# Patient Record
Sex: Female | Born: 1945 | Race: White | Hispanic: No | Marital: Married | Smoking: Never smoker
Health system: Southern US, Community
[De-identification: ages and names within clinical notes are randomized; demographics above are authoritative.]

## PROBLEM LIST (undated history)

## (undated) DIAGNOSIS — I10 Essential (primary) hypertension: Secondary | ICD-10-CM

## (undated) DIAGNOSIS — E7800 Pure hypercholesterolemia, unspecified: Secondary | ICD-10-CM

## (undated) DIAGNOSIS — I5030 Unspecified diastolic (congestive) heart failure: Secondary | ICD-10-CM

## (undated) DIAGNOSIS — G4730 Sleep apnea, unspecified: Secondary | ICD-10-CM

## (undated) DIAGNOSIS — M1990 Unspecified osteoarthritis, unspecified site: Secondary | ICD-10-CM

## (undated) HISTORY — DX: Essential (primary) hypertension: I10

## (undated) HISTORY — DX: Pure hypercholesterolemia, unspecified: E78.00

## (undated) HISTORY — DX: Unspecified osteoarthritis, unspecified site: M19.90

## (undated) HISTORY — PX: OTHER SURGICAL HISTORY: SHX169

## (undated) HISTORY — DX: Sleep apnea, unspecified: G47.30

## (undated) HISTORY — DX: Unspecified diastolic (congestive) heart failure: I50.30

## (undated) HISTORY — PX: BREAST BIOPSY: SHX20

---

## 1973-02-02 HISTORY — PX: TUBAL LIGATION: SHX77

## 1975-02-03 HISTORY — PX: PARTIAL HYSTERECTOMY: SHX80

## 1993-02-02 HISTORY — PX: ABDOMINAL HERNIA REPAIR: SHX539

## 1998-02-02 HISTORY — PX: CHOLECYSTECTOMY: SHX55

## 2006-02-02 HISTORY — PX: BREAST BIOPSY: SHX20

## 2006-09-22 ENCOUNTER — Encounter: Admission: RE | Admit: 2006-09-22 | Discharge: 2006-09-22 | Payer: Self-pay | Admitting: Unknown Physician Specialty

## 2006-09-22 ENCOUNTER — Encounter (INDEPENDENT_AMBULATORY_CARE_PROVIDER_SITE_OTHER): Payer: Self-pay | Admitting: Diagnostic Radiology

## 2007-09-27 ENCOUNTER — Encounter: Admission: RE | Admit: 2007-09-27 | Discharge: 2007-09-27 | Payer: Self-pay | Admitting: Unknown Physician Specialty

## 2008-09-27 ENCOUNTER — Encounter: Admission: RE | Admit: 2008-09-27 | Discharge: 2008-09-27 | Payer: Self-pay | Admitting: Unknown Physician Specialty

## 2009-10-11 ENCOUNTER — Encounter: Admission: RE | Admit: 2009-10-11 | Discharge: 2009-10-11 | Payer: Self-pay | Admitting: Unknown Physician Specialty

## 2010-09-10 ENCOUNTER — Other Ambulatory Visit: Payer: Self-pay | Admitting: Unknown Physician Specialty

## 2010-09-10 DIAGNOSIS — Z1231 Encounter for screening mammogram for malignant neoplasm of breast: Secondary | ICD-10-CM

## 2010-10-14 ENCOUNTER — Ambulatory Visit
Admission: RE | Admit: 2010-10-14 | Discharge: 2010-10-14 | Disposition: A | Discharge status: Home / Self Care | Payer: Medicare Other | Source: Ambulatory Visit | Attending: Unknown Physician Specialty | Admitting: Unknown Physician Specialty

## 2010-10-14 DIAGNOSIS — Z1231 Encounter for screening mammogram for malignant neoplasm of breast: Secondary | ICD-10-CM

## 2010-10-15 ENCOUNTER — Ambulatory Visit: Payer: Self-pay

## 2011-08-27 ENCOUNTER — Other Ambulatory Visit: Payer: Self-pay | Admitting: Unknown Physician Specialty

## 2011-08-27 DIAGNOSIS — Z1231 Encounter for screening mammogram for malignant neoplasm of breast: Secondary | ICD-10-CM

## 2011-10-22 ENCOUNTER — Ambulatory Visit
Admission: RE | Admit: 2011-10-22 | Discharge: 2011-10-22 | Disposition: A | Discharge status: Home / Self Care | Payer: Medicare Other | Source: Ambulatory Visit | Attending: Unknown Physician Specialty | Admitting: Unknown Physician Specialty

## 2011-10-22 DIAGNOSIS — Z1231 Encounter for screening mammogram for malignant neoplasm of breast: Secondary | ICD-10-CM

## 2012-09-09 ENCOUNTER — Other Ambulatory Visit: Payer: Self-pay

## 2012-09-09 DIAGNOSIS — Z1231 Encounter for screening mammogram for malignant neoplasm of breast: Secondary | ICD-10-CM

## 2012-10-24 ENCOUNTER — Ambulatory Visit
Admission: RE | Admit: 2012-10-24 | Discharge: 2012-10-24 | Disposition: A | Discharge status: Home / Self Care | Payer: Medicare Other | Source: Ambulatory Visit

## 2012-10-24 DIAGNOSIS — Z1231 Encounter for screening mammogram for malignant neoplasm of breast: Secondary | ICD-10-CM

## 2012-10-27 ENCOUNTER — Other Ambulatory Visit: Payer: Self-pay | Admitting: Unknown Physician Specialty

## 2012-10-27 DIAGNOSIS — R928 Other abnormal and inconclusive findings on diagnostic imaging of breast: Secondary | ICD-10-CM

## 2012-12-05 ENCOUNTER — Ambulatory Visit
Admission: RE | Admit: 2012-12-05 | Discharge: 2012-12-05 | Disposition: A | Discharge status: Home / Self Care | Payer: Medicare Other | Source: Ambulatory Visit | Attending: Unknown Physician Specialty | Admitting: Unknown Physician Specialty

## 2012-12-05 ENCOUNTER — Other Ambulatory Visit: Payer: Medicare Other

## 2012-12-05 DIAGNOSIS — R928 Other abnormal and inconclusive findings on diagnostic imaging of breast: Secondary | ICD-10-CM

## 2013-02-02 HISTORY — PX: HAND SURGERY: SHX662

## 2013-10-17 ENCOUNTER — Other Ambulatory Visit: Payer: Self-pay

## 2013-10-17 DIAGNOSIS — Z1231 Encounter for screening mammogram for malignant neoplasm of breast: Secondary | ICD-10-CM

## 2014-01-01 ENCOUNTER — Ambulatory Visit: Payer: Medicare Other

## 2014-01-16 ENCOUNTER — Encounter (INDEPENDENT_AMBULATORY_CARE_PROVIDER_SITE_OTHER): Payer: Self-pay | Admitting: *Deleted

## 2014-01-17 ENCOUNTER — Inpatient Hospital Stay: Admission: RE | Admit: 2014-01-17 | Payer: Medicare Other | Source: Ambulatory Visit

## 2014-01-17 ENCOUNTER — Ambulatory Visit
Admission: RE | Admit: 2014-01-17 | Discharge: 2014-01-17 | Disposition: A | Discharge status: Home / Self Care | Payer: Medicare Other | Source: Ambulatory Visit

## 2014-01-17 DIAGNOSIS — Z1231 Encounter for screening mammogram for malignant neoplasm of breast: Secondary | ICD-10-CM

## 2014-04-25 ENCOUNTER — Encounter (INDEPENDENT_AMBULATORY_CARE_PROVIDER_SITE_OTHER): Payer: Self-pay | Admitting: *Deleted

## 2014-04-25 ENCOUNTER — Other Ambulatory Visit (INDEPENDENT_AMBULATORY_CARE_PROVIDER_SITE_OTHER): Payer: Self-pay | Admitting: *Deleted

## 2014-04-25 DIAGNOSIS — Z1211 Encounter for screening for malignant neoplasm of colon: Secondary | ICD-10-CM

## 2014-05-08 ENCOUNTER — Telehealth: Payer: Self-pay

## 2014-05-08 NOTE — Telephone Encounter (Signed)
Received a referral on this pt from [facility] in [place].  Pt is already scheduled for colonoscopy with Dr. Laural Golden on 07/04/2014. I faxed the referral back to that office and let them know of that appt.

## 2014-05-21 ENCOUNTER — Telehealth: Payer: Self-pay

## 2014-05-21 NOTE — Telephone Encounter (Signed)
[place] HER TO SCHEDULE HER COLONOSCOPY  CELL IF NEEDED IS [phone]

## 2014-05-22 NOTE — Telephone Encounter (Signed)
I called pt and she has problems with constipation. Last colonoscopy was 2005 in [place]. She is scheduled for OV with Walden Field, NP on 06/11/2014 at 10:30 Am.  Per pt request, holding a slot on RMR schedule for TCS on 06/15/2014 at 10:30 ( she has to come from [place]).

## 2014-05-23 NOTE — Telephone Encounter (Signed)
Pt decided to hold the time for colonoscopy to 06/18/2014 at 10:30 Am .

## 2014-06-11 ENCOUNTER — Ambulatory Visit (INDEPENDENT_AMBULATORY_CARE_PROVIDER_SITE_OTHER): Payer: Medicare Other | Admitting: Nurse Practitioner

## 2014-06-11 ENCOUNTER — Encounter: Payer: Self-pay | Admitting: Nurse Practitioner

## 2014-06-11 ENCOUNTER — Encounter (INDEPENDENT_AMBULATORY_CARE_PROVIDER_SITE_OTHER): Payer: Self-pay

## 2014-06-11 ENCOUNTER — Other Ambulatory Visit: Payer: Self-pay

## 2014-06-11 VITALS — BP 152/92 | HR 66 | Temp 97.0°F | Ht 61.0 in | Wt 162.8 lb

## 2014-06-11 DIAGNOSIS — Z1211 Encounter for screening for malignant neoplasm of colon: Secondary | ICD-10-CM | POA: Insufficient documentation

## 2014-06-11 MED ORDER — NA SULFATE-K SULFATE-MG SULF 17.5-3.13-1.6 GM/177ML PO SOLN: 1.0000 | ORAL | Status: DC

## 2014-06-11 NOTE — Assessment & Plan Note (Signed)
69 year old female presents on referral from PCP for routine screening colonoscopy. Last colonoscopy in 2005 by Dr. Docia Furl who is since retired. No polyps found on this colonoscopy, no family history of colon cancer. Patient does have constipation, however it is baseline for her and she tolerates it well as well as treated over-the-counter as needed. At this point were proceed with her screening colonoscopy.  Proceed with TCS with Dr. Gala Romney in near future: the risks, benefits, and alternatives have been discussed with the patient in detail. The patient states understanding and desires to proceed.  Patient is not on anticoagulants or antidiabetic medications. Is not taking long-term pain medications or anti-anxiety medications. Denies drug use. Social alcohol use of approximately 1 glass of wine a week. At this point she should be appropriate for conscious sedation.

## 2014-06-11 NOTE — Progress Notes (Signed)
Primary Care Physician:  Lavonia Dana, MD Primary Gastroenterologist:  Dr. Gala Romney  Chief Complaint  Patient presents with  . set up TCS    HPI:   69 year old female patient referred to [place] by primary care for routine follow-up screening colonoscopy. PCP notes reviewed. Per PCP note, last patient colonoscopy was 10 years ago in 2005 by Dr.Shiflett which found no polyps. Patient without history of polyps or family history of colon cancer, recommended repeat in 10 years. Patient seen in office visit rather than triage due to constipation.  Today he states her 2005 colonoscopy was her first colonoscopy (age 46). Confirms no history of polyps or family history of colon cancer. Was due for repeat last year but was having hand surgery so it had to be postponed. Denies abdomina pain. Admits increased flatulence with a foul odor. Is having constipation, has a bowel movement anywhere from every other day to 1-2 times a week and it fluctuates. This is her baseline. Denies hematochezia and melena, unintentional weight loss, fever, chills, chest pain, dyspnea, weakness, dizziness, fatigue, syncope, and near syncope. Has tried Miralax for constipation and couldn't tolerate it due to diarrhea and unpalatable. Has tried dulcolax, tries to not take them, but will take them if she hasn't had a bowel movement recently. Drinks adequate water daily, walks often, eats a high fiber diet. Takes Naproxen as needed (typically once every 2-3), denies ASA powders or any other NSAID use.  Past Medical History  Diagnosis Date  . High cholesterol   . Hypertension   . Arthritis     Past Surgical History  Procedure Laterality Date  . Bone spur removal    . Abdominal hernia repair  1995  . Tubal ligation  1975  . Cholecystectomy  2000  . Partial hysterectomy  1977    Current Outpatient Prescriptions  Medication Sig Dispense Refill  . aspirin 81 MG tablet Take 81 mg by mouth daily.    . Calcium  Carbonate-Vitamin D (CALCIUM + D PO) Take 500 mg by mouth 2 (two) times daily.    . cholecalciferol (VITAMIN D) 1000 UNITS tablet Take 2,000 Units by mouth daily.    Marland Kitchen estradiol (ESTRACE) 1 MG tablet     . fexofenadine (ALLEGRA) 180 MG tablet Take 180 mg by mouth as needed for allergies or rhinitis.    . fluticasone (FLONASE) 50 MCG/ACT nasal spray Place 1 spray into both nostrils daily.    Marland Kitchen glucosamine-chondroitin 500-400 MG tablet Take 1 tablet by mouth 3 (three) times daily.    Marland Kitchen KRILL OIL PO Take 1 capsule by mouth 3 (three) times a week.    . naproxen sodium (ANAPROX) 220 MG tablet Take 220 mg by mouth as needed.     Marland Kitchen omeprazole (PRILOSEC) 40 MG capsule Take 40 mg by mouth daily.    . simvastatin (ZOCOR) 40 MG tablet      No current facility-administered medications for this visit.    Allergies as of 06/11/2014  . (No Known Allergies)    Family History  Problem Relation Age of Onset  . Colon cancer Neg Hx   . Colon polyps Neg Hx   . Hypercholesterolemia Mother     History   Social History  . Marital Status: Unknown    Spouse Name: N/A  . Number of Children: N/A  . Years of Education: N/A   Occupational History  . Not on file.   Social History Main Topics  . Smoking status: Never Smoker   .  Smokeless tobacco: Never Used  . Alcohol Use: 0.0 oz/week    0 Standard drinks or equivalent per week     Comment: occasional, approximately 1 glass of wine a week  . Drug Use: No  . Sexual Activity: Not on file   Other Topics Concern  . Not on file   Social History Narrative    Review of Systems: General: Negative for anorexia, weight loss, fever, chills, fatigue, weakness. Eyes: Negative for vision changes.  ENT: Negative for hoarseness, difficulty swallowing. CV: Negative for chest pain, angina, palpitations, peripheral edema.  Respiratory: Negative for dyspnea at rest, cough, wheezing.  GI: See history of present illness.  MS: Admits chronic arthritic joint  pain..  Derm: Negative for rash or itching.  Neuro: Negative for weakness, memory loss, confusion.  Psych: Negative for anxiety, depression.  Endo: Negative for unusual weight change.  Heme: Negative for bruising or bleeding. Allergy: Negative for rash or hives.    Physical Exam: BP 152/92 mmHg  Pulse 66  Temp(Src) 97 F (36.1 C)  Ht 5\' 1"  (1.549 m)  Wt 162 lb 12.8 oz (73.846 kg)  BMI 30.78 kg/m2 General:   Alert and oriented. Pleasant and cooperative. Well-nourished and well-developed.  Head:  Normocephalic and atraumatic. Eyes:  Without icterus, sclera clear and conjunctiva pink.  Ears:  Normal auditory acuity. Mouth:  No deformity or lesions, oral mucosa pink. No OP edema. Neck:  Supple, without mass or thyromegaly. Lungs:  Clear to auscultation bilaterally. No wheezes, rales, or rhonchi. No distress.  Heart:  S1, S2 present without murmurs appreciated.  Abdomen:  +BS, soft, non-tender and non-distended. No HSM noted. No guarding or rebound. No masses appreciated.  Rectal:  Deferred  Msk:  Symmetrical without gross deformities. Normal posture. Pulses:  Normal DP pulses noted. Extremities:  Without clubbing or edema. Neurologic:  Alert and  oriented x4;  grossly normal neurologically. Skin:  Intact without significant lesions or rashes. Cervical Nodes:  No significant cervical adenopathy. Psych:  Alert and cooperative. Normal mood and affect.     06/11/2014 11:50 AM

## 2014-06-11 NOTE — Patient Instructions (Signed)
1. We will schedule your procedure (colonoscopy) for you. 2. Further recommendations to be based on results of your procedure.

## 2014-06-18 ENCOUNTER — Encounter (HOSPITAL_COMMUNITY)
Admission: RE | Disposition: A | Discharge status: Home / Self Care | Payer: Self-pay | Source: Ambulatory Visit | Attending: Internal Medicine

## 2014-06-18 ENCOUNTER — Ambulatory Visit (HOSPITAL_COMMUNITY)
Admission: RE | Admit: 2014-06-18 | Discharge: 2014-06-18 | Disposition: A | Discharge status: Home / Self Care | Payer: Medicare Other | Source: Ambulatory Visit | Attending: Internal Medicine | Admitting: Internal Medicine

## 2014-06-18 ENCOUNTER — Encounter (HOSPITAL_COMMUNITY): Payer: Self-pay | Admitting: *Deleted

## 2014-06-18 DIAGNOSIS — Z9049 Acquired absence of other specified parts of digestive tract: Secondary | ICD-10-CM | POA: Diagnosis not present

## 2014-06-18 DIAGNOSIS — Z79899 Other long term (current) drug therapy: Secondary | ICD-10-CM | POA: Diagnosis not present

## 2014-06-18 DIAGNOSIS — Z791 Long term (current) use of non-steroidal anti-inflammatories (NSAID): Secondary | ICD-10-CM | POA: Insufficient documentation

## 2014-06-18 DIAGNOSIS — K5730 Diverticulosis of large intestine without perforation or abscess without bleeding: Secondary | ICD-10-CM | POA: Diagnosis not present

## 2014-06-18 DIAGNOSIS — Z1211 Encounter for screening for malignant neoplasm of colon: Secondary | ICD-10-CM | POA: Diagnosis present

## 2014-06-18 DIAGNOSIS — E780 Pure hypercholesterolemia: Secondary | ICD-10-CM | POA: Diagnosis not present

## 2014-06-18 DIAGNOSIS — Z7982 Long term (current) use of aspirin: Secondary | ICD-10-CM | POA: Diagnosis not present

## 2014-06-18 DIAGNOSIS — I10 Essential (primary) hypertension: Secondary | ICD-10-CM | POA: Insufficient documentation

## 2014-06-18 DIAGNOSIS — M1990 Unspecified osteoarthritis, unspecified site: Secondary | ICD-10-CM | POA: Diagnosis not present

## 2014-06-18 HISTORY — PX: COLONOSCOPY: SHX5424

## 2014-06-18 SURGERY — COLONOSCOPY
Anesthesia: Moderate Sedation

## 2014-06-18 MED ORDER — ONDANSETRON HCL 4 MG/2ML IJ SOLN
INTRAMUSCULAR | Status: DC | PRN
  Administered 2014-06-18: 4 mg via INTRAVENOUS

## 2014-06-18 MED ORDER — ONDANSETRON HCL 4 MG/2ML IJ SOLN
INTRAMUSCULAR | Status: AC
  Filled 2014-06-18: qty 2

## 2014-06-18 MED ORDER — MIDAZOLAM HCL 5 MG/5ML IJ SOLN
INTRAMUSCULAR | Status: AC
  Filled 2014-06-18: qty 10

## 2014-06-18 MED ORDER — STERILE WATER FOR IRRIGATION IR SOLN
Status: DC | PRN
  Administered 2014-06-18: 11:00:00

## 2014-06-18 MED ORDER — SODIUM CHLORIDE 0.9 % IV SOLN
INTRAVENOUS | Status: DC
  Administered 2014-06-18: 10:00:00 via INTRAVENOUS

## 2014-06-18 MED ORDER — MIDAZOLAM HCL 5 MG/5ML IJ SOLN
INTRAMUSCULAR | Status: DC | PRN
  Administered 2014-06-18 (×4): 1 mg via INTRAVENOUS
  Administered 2014-06-18 (×2): 2 mg via INTRAVENOUS
  Administered 2014-06-18: 1 mg via INTRAVENOUS

## 2014-06-18 MED ORDER — MEPERIDINE HCL 100 MG/ML IJ SOLN
INTRAMUSCULAR | Status: DC | PRN
  Administered 2014-06-18: 25 mg via INTRAVENOUS
  Administered 2014-06-18: 50 mg via INTRAVENOUS
  Administered 2014-06-18: 25 mg via INTRAVENOUS

## 2014-06-18 MED ORDER — MEPERIDINE HCL 100 MG/ML IJ SOLN
INTRAMUSCULAR | Status: DC
Start: 2014-06-18 — End: 2014-06-18
  Filled 2014-06-18: qty 2

## 2014-06-18 NOTE — Interval H&P Note (Signed)
History and Physical Interval Note:  06/18/2014 10:47 AM  Ana Chandler  has presented today for surgery, with the diagnosis of SCREENING  The various methods of treatment have been discussed with the patient and family. After consideration of risks, benefits and other options for treatment, the patient has consented to  Procedure(s) with comments: COLONOSCOPY (N/A) - 1030  as a surgical intervention .  The patient's history has been reviewed, patient examined, no change in status, stable for surgery.  I have reviewed the patient's chart and labs.  Questions were answered to the patient's satisfaction.     Hicks Feick  No change. Average risk screening colonoscopy per plan.  The risks, benefits, limitations, alternatives and imponderables have been reviewed with the patient. Questions have been answered. All parties are agreeable.

## 2014-06-18 NOTE — Op Note (Signed)
[facility] [address] [place], [address]   COLONOSCOPY PROCEDURE REPORT  PATIENT: Ana Chandler, Ana Chandler  MR#: [id number] BIRTHDATE: 1945-02-04 , 68  yrs. old GENDER: female ENDOSCOPIST: R.  Garfield Cornea, MD [facility] REFERRED BY:   Dr. Philipp Deputy PROCEDURE DATE:  July 16, 2014 PROCEDURE:   Screening colonoscopy INDICATIONS:Average risk colorectal cancer screening examination MEDICATIONS: Versed 9 mg IV and Demerol 100 mg IV in divided doses. Zofran 4 mg IV ASA CLASS:       Class II  CONSENT: The risks, benefits, alternatives and imponderables including but not limited to bleeding, perforation as well as the possibility of a missed lesion have been reviewed.  The potential for biopsy, lesion removal, etc. have also been discussed. Questions have been answered.  All parties agreeable.  Please see the history and physical in the medical record for more information.  DESCRIPTION OF PROCEDURE:   After the risks benefits and alternatives of the procedure were thoroughly explained, informed consent was obtained.  The digital rectal exam      The EC-[id number] ([id number])  endoscope was introduced through the anus and advanced to the cecum, which was identified by both the appendix and ileocecal valve. No adverse events experienced.   The quality of the prep was adequate  The instrument was then slowly withdrawn as the colon was fully examined.      COLON FINDINGS: Normal-appearing rectum.  Scattered pancolonic diverticula; the remainder of the colonic mucosa appeared normal. Retroflexion was performed.      .  Withdrawal time=6 minutes .   The scope was withdrawn and the procedure completed. COMPLICATIONS: There were no immediate complications.  ENDOSCOPIC IMPRESSION: Pancolonic diverticulosis  RECOMMENDATIONS: I do not necessarily recommend a future colonoscopy unless patient develops new symptoms.  eSigned:  R. Garfield Cornea, MD Rosalita Chessman [facility] 2014/07/16 11:26  AM   cc:  CPT CODES: ICD CODES:  The ICD and CPT codes recommended by this software are interpretations from the data that the clinical staff has captured with the software.  The verification of the translation of this report to the ICD and CPT codes and modifiers is the sole responsibility of the health care institution and practicing physician where this report was generated.  [employer]. will not be held responsible for the validity of the ICD and CPT codes included on this report.  AMA assumes no liability for data contained or not contained herein. CPT is a Designer, television/film set of the [employer].  PATIENT NAME:  Ana Chandler, Ana Chandler MR#: [id number]

## 2014-06-18 NOTE — H&P (View-Only) (Signed)
Primary Care Physician:  Lavonia Dana, MD Primary Gastroenterologist:  Dr. Gala Romney  Chief Complaint  Patient presents with  . set up TCS    HPI:   69 year old female patient referred to [place] by primary care for routine follow-up screening colonoscopy. PCP notes reviewed. Per PCP note, last patient colonoscopy was 10 years ago in 2005 by Dr.Shiflett which found no polyps. Patient without history of polyps or family history of colon cancer, recommended repeat in 10 years. Patient seen in office visit rather than triage due to constipation.  Today he states her 2005 colonoscopy was her first colonoscopy (age 57). Confirms no history of polyps or family history of colon cancer. Was due for repeat last year but was having hand surgery so it had to be postponed. Denies abdomina pain. Admits increased flatulence with a foul odor. Is having constipation, has a bowel movement anywhere from every other day to 1-2 times a week and it fluctuates. This is her baseline. Denies hematochezia and melena, unintentional weight loss, fever, chills, chest pain, dyspnea, weakness, dizziness, fatigue, syncope, and near syncope. Has tried Miralax for constipation and couldn't tolerate it due to diarrhea and unpalatable. Has tried dulcolax, tries to not take them, but will take them if she hasn't had a bowel movement recently. Drinks adequate water daily, walks often, eats a high fiber diet. Takes Naproxen as needed (typically once every 2-3), denies ASA powders or any other NSAID use.  Past Medical History  Diagnosis Date  . High cholesterol   . Hypertension   . Arthritis     Past Surgical History  Procedure Laterality Date  . Bone spur removal    . Abdominal hernia repair  1995  . Tubal ligation  1975  . Cholecystectomy  2000  . Partial hysterectomy  1977    Current Outpatient Prescriptions  Medication Sig Dispense Refill  . aspirin 81 MG tablet Take 81 mg by mouth daily.    . Calcium  Carbonate-Vitamin D (CALCIUM + D PO) Take 500 mg by mouth 2 (two) times daily.    . cholecalciferol (VITAMIN D) 1000 UNITS tablet Take 2,000 Units by mouth daily.    Marland Kitchen estradiol (ESTRACE) 1 MG tablet     . fexofenadine (ALLEGRA) 180 MG tablet Take 180 mg by mouth as needed for allergies or rhinitis.    . fluticasone (FLONASE) 50 MCG/ACT nasal spray Place 1 spray into both nostrils daily.    Marland Kitchen glucosamine-chondroitin 500-400 MG tablet Take 1 tablet by mouth 3 (three) times daily.    Marland Kitchen KRILL OIL PO Take 1 capsule by mouth 3 (three) times a week.    . naproxen sodium (ANAPROX) 220 MG tablet Take 220 mg by mouth as needed.     Marland Kitchen omeprazole (PRILOSEC) 40 MG capsule Take 40 mg by mouth daily.    . simvastatin (ZOCOR) 40 MG tablet      No current facility-administered medications for this visit.    Allergies as of 06/11/2014  . (No Known Allergies)    Family History  Problem Relation Age of Onset  . Colon cancer Neg Hx   . Colon polyps Neg Hx   . Hypercholesterolemia Mother     History   Social History  . Marital Status: Unknown    Spouse Name: N/A  . Number of Children: N/A  . Years of Education: N/A   Occupational History  . Not on file.   Social History Main Topics  . Smoking status: Never Smoker   .  Smokeless tobacco: Never Used  . Alcohol Use: 0.0 oz/week    0 Standard drinks or equivalent per week     Comment: occasional, approximately 1 glass of wine a week  . Drug Use: No  . Sexual Activity: Not on file   Other Topics Concern  . Not on file   Social History Narrative    Review of Systems: General: Negative for anorexia, weight loss, fever, chills, fatigue, weakness. Eyes: Negative for vision changes.  ENT: Negative for hoarseness, difficulty swallowing. CV: Negative for chest pain, angina, palpitations, peripheral edema.  Respiratory: Negative for dyspnea at rest, cough, wheezing.  GI: See history of present illness.  MS: Admits chronic arthritic joint  pain..  Derm: Negative for rash or itching.  Neuro: Negative for weakness, memory loss, confusion.  Psych: Negative for anxiety, depression.  Endo: Negative for unusual weight change.  Heme: Negative for bruising or bleeding. Allergy: Negative for rash or hives.    Physical Exam: BP 152/92 mmHg  Pulse 66  Temp(Src) 97 F (36.1 C)  Ht 5\' 1"  (1.549 m)  Wt 162 lb 12.8 oz (73.846 kg)  BMI 30.78 kg/m2 General:   Alert and oriented. Pleasant and cooperative. Well-nourished and well-developed.  Head:  Normocephalic and atraumatic. Eyes:  Without icterus, sclera clear and conjunctiva pink.  Ears:  Normal auditory acuity. Mouth:  No deformity or lesions, oral mucosa pink. No OP edema. Neck:  Supple, without mass or thyromegaly. Lungs:  Clear to auscultation bilaterally. No wheezes, rales, or rhonchi. No distress.  Heart:  S1, S2 present without murmurs appreciated.  Abdomen:  +BS, soft, non-tender and non-distended. No HSM noted. No guarding or rebound. No masses appreciated.  Rectal:  Deferred  Msk:  Symmetrical without gross deformities. Normal posture. Pulses:  Normal DP pulses noted. Extremities:  Without clubbing or edema. Neurologic:  Alert and  oriented x4;  grossly normal neurologically. Skin:  Intact without significant lesions or rashes. Cervical Nodes:  No significant cervical adenopathy. Psych:  Alert and cooperative. Normal mood and affect.     06/11/2014 11:50 AM

## 2014-06-18 NOTE — Discharge Instructions (Signed)
Colonoscopy Discharge Instructions  Read the instructions outlined below and refer to this sheet in the next few weeks. These discharge instructions provide you with general information on caring for yourself after you leave the hospital. Your doctor may also give you specific instructions. While your treatment has been planned according to the most current medical practices available, unavoidable complications occasionally occur. If you have any problems or questions after discharge, call Dr. Gala Romney at [phone]. ACTIVITY  You may resume your regular activity, but move at a slower pace for the next 24 hours.   Take frequent rest periods for the next 24 hours.   Walking will help get rid of the air and reduce the bloated feeling in your belly (abdomen).   No driving for 24 hours (because of the medicine (anesthesia) used during the test).    Do not sign any important legal documents or operate any machinery for 24 hours (because of the anesthesia used during the test).  NUTRITION  Drink plenty of fluids.   You may resume your normal diet as instructed by your doctor.   Begin with a light meal and progress to your normal diet. Heavy or fried foods are harder to digest and may make you feel sick to your stomach (nauseated).   Avoid alcoholic beverages for 24 hours or as instructed.  MEDICATIONS  You may resume your normal medications unless your doctor tells you otherwise.  WHAT YOU CAN EXPECT TODAY  Some feelings of bloating in the abdomen.   Passage of more gas than usual.   Spotting of blood in your stool or on the toilet paper.  IF YOU HAD POLYPS REMOVED DURING THE COLONOSCOPY:  No aspirin products for 7 days or as instructed.   No alcohol for 7 days or as instructed.   Eat a soft diet for the next 24 hours.  FINDING OUT THE RESULTS OF YOUR TEST Not all test results are available during your visit. If your test results are not back during the visit, make an appointment  with your caregiver to find out the results. Do not assume everything is normal if you have not heard from your caregiver or the medical facility. It is important for you to follow up on all of your test results.  SEEK IMMEDIATE MEDICAL ATTENTION IF:  You have more than a spotting of blood in your stool.   Your belly is swollen (abdominal distention).   You are nauseated or vomiting.   You have a temperature over 101.   You have abdominal pain or discomfort that is severe or gets worse throughout the day.    Diverticulosis information provided  I do not necessarily recommend a future colonoscopy unless she develops symptoms   Diverticulosis Diverticulosis is the condition that develops when small pouches (diverticula) form in the wall of your colon. Your colon, or large intestine, is where water is absorbed and stool is formed. The pouches form when the inside layer of your colon pushes through weak spots in the outer layers of your colon. CAUSES  No one knows exactly what causes diverticulosis. RISK FACTORS  Being older than 60. Your risk for this condition increases with age. Diverticulosis is rare in people younger than 40 years. By age 33, almost everyone has it.  Eating a low-fiber diet.  Being frequently constipated.  Being overweight.  Not getting enough exercise.  Smoking.  Taking over-the-counter pain medicines, like aspirin and ibuprofen. SYMPTOMS  Most people with diverticulosis do not have symptoms. DIAGNOSIS  Because diverticulosis often has no symptoms, health care providers often discover the condition during an exam for other colon problems. In many cases, a health care provider will diagnose diverticulosis while using a flexible scope to examine the colon (colonoscopy). TREATMENT  If you have never developed an infection related to diverticulosis, you may not need treatment. If you have had an infection before, treatment may include:  Eating more fruits,  vegetables, and grains.  Taking a fiber supplement.  Taking a live bacteria supplement (probiotic).  Taking medicine to relax your colon. HOME CARE INSTRUCTIONS   Drink at least 6-8 glasses of water each day to prevent constipation.  Try not to strain when you have a bowel movement.  Keep all follow-up appointments. If you have had an infection before:  Increase the fiber in your diet as directed by your health care provider or dietitian.  Take a dietary fiber supplement if your health care provider approves.  Only take medicines as directed by your health care provider. SEEK MEDICAL CARE IF:   You have abdominal pain.  You have bloating.  You have cramps.  You have not gone to the bathroom in 3 days. SEEK IMMEDIATE MEDICAL CARE IF:   Your pain gets worse.  Yourbloating becomes very bad.  You have a fever or chills, and your symptoms suddenly get worse.  You begin vomiting.  You have bowel movements that are bloody or black. MAKE SURE YOU:  Understand these instructions.  Will watch your condition.  Will get help right away if you are not doing well or get worse. Document Released: 10/17/2003 Document Revised: 01/24/2013 Document Reviewed: 12/14/2012 [facility] Patient Information 2015 [place], [place]. This information is not intended to replace advice given to you by your health care provider. Make sure you discuss any questions you have with your health care provider.

## 2014-06-20 ENCOUNTER — Encounter (HOSPITAL_COMMUNITY): Payer: Self-pay | Admitting: Internal Medicine

## 2014-06-21 NOTE — Progress Notes (Signed)
CC'ED TO PCP 

## 2014-07-04 ENCOUNTER — Encounter: Admission: RE | Payer: Self-pay | Source: Ambulatory Visit

## 2014-07-04 ENCOUNTER — Ambulatory Visit: Admission: RE | Admit: 2014-07-04 | Payer: Medicare Other | Source: Ambulatory Visit | Admitting: Internal Medicine

## 2014-07-04 SURGERY — COLONOSCOPY
Anesthesia: Moderate Sedation

## 2014-09-24 ENCOUNTER — Other Ambulatory Visit: Payer: Self-pay

## 2014-09-24 DIAGNOSIS — Z1231 Encounter for screening mammogram for malignant neoplasm of breast: Secondary | ICD-10-CM

## 2015-01-21 ENCOUNTER — Ambulatory Visit
Admission: RE | Admit: 2015-01-21 | Discharge: 2015-01-21 | Disposition: A | Discharge status: Home / Self Care | Payer: Medicare Other | Source: Ambulatory Visit

## 2015-01-21 DIAGNOSIS — Z1231 Encounter for screening mammogram for malignant neoplasm of breast: Secondary | ICD-10-CM

## 2015-02-03 HISTORY — PX: KNEE ARTHROSCOPY: SUR90

## 2015-12-17 ENCOUNTER — Other Ambulatory Visit: Payer: Self-pay | Admitting: Unknown Physician Specialty

## 2015-12-17 DIAGNOSIS — Z1231 Encounter for screening mammogram for malignant neoplasm of breast: Secondary | ICD-10-CM

## 2016-01-22 ENCOUNTER — Ambulatory Visit
Admission: RE | Admit: 2016-01-22 | Discharge: 2016-01-22 | Disposition: A | Discharge status: Home / Self Care | Payer: Medicare Other | Source: Ambulatory Visit | Attending: Unknown Physician Specialty | Admitting: Unknown Physician Specialty

## 2016-01-22 DIAGNOSIS — Z1231 Encounter for screening mammogram for malignant neoplasm of breast: Secondary | ICD-10-CM

## 2016-02-03 DIAGNOSIS — R0600 Dyspnea, unspecified: Secondary | ICD-10-CM

## 2016-02-03 HISTORY — DX: Dyspnea, unspecified: R06.00

## 2017-01-15 ENCOUNTER — Other Ambulatory Visit: Payer: Self-pay | Admitting: Obstetrics and Gynecology

## 2017-01-15 DIAGNOSIS — Z1231 Encounter for screening mammogram for malignant neoplasm of breast: Secondary | ICD-10-CM

## 2017-02-02 HISTORY — PX: EYE SURGERY: SHX253

## 2017-02-02 HISTORY — PX: CARDIAC CATHETERIZATION: SHX172

## 2017-03-29 ENCOUNTER — Ambulatory Visit
Admission: RE | Admit: 2017-03-29 | Discharge: 2017-03-29 | Disposition: A | Discharge status: Home / Self Care | Payer: Medicare Other | Source: Ambulatory Visit | Attending: Obstetrics and Gynecology | Admitting: Obstetrics and Gynecology

## 2017-03-29 DIAGNOSIS — Z1231 Encounter for screening mammogram for malignant neoplasm of breast: Secondary | ICD-10-CM

## 2017-12-08 ENCOUNTER — Other Ambulatory Visit (HOSPITAL_COMMUNITY): Payer: Self-pay | Admitting: Otolaryngology

## 2017-12-08 DIAGNOSIS — E041 Nontoxic single thyroid nodule: Secondary | ICD-10-CM

## 2017-12-13 ENCOUNTER — Ambulatory Visit (HOSPITAL_COMMUNITY)
Admission: RE | Admit: 2017-12-13 | Discharge: 2017-12-13 | Disposition: A | Discharge status: Home / Self Care | Payer: Medicare Other | Source: Ambulatory Visit | Attending: Otolaryngology | Admitting: Otolaryngology

## 2017-12-13 DIAGNOSIS — E042 Nontoxic multinodular goiter: Secondary | ICD-10-CM | POA: Diagnosis not present

## 2017-12-13 DIAGNOSIS — E041 Nontoxic single thyroid nodule: Secondary | ICD-10-CM | POA: Diagnosis present

## 2017-12-15 ENCOUNTER — Other Ambulatory Visit: Payer: Self-pay | Admitting: Otolaryngology

## 2017-12-15 DIAGNOSIS — E041 Nontoxic single thyroid nodule: Secondary | ICD-10-CM

## 2017-12-16 ENCOUNTER — Other Ambulatory Visit: Payer: Self-pay | Admitting: Otolaryngology

## 2017-12-16 DIAGNOSIS — E041 Nontoxic single thyroid nodule: Secondary | ICD-10-CM

## 2018-01-06 ENCOUNTER — Ambulatory Visit
Admission: RE | Admit: 2018-01-06 | Discharge: 2018-01-06 | Disposition: A | Discharge status: Home / Self Care | Payer: Medicare Other | Source: Ambulatory Visit | Attending: Otolaryngology | Admitting: Otolaryngology

## 2018-01-06 ENCOUNTER — Other Ambulatory Visit (HOSPITAL_COMMUNITY)
Admission: RE | Admit: 2018-01-06 | Discharge: 2018-01-06 | Disposition: A | Discharge status: Home / Self Care | Payer: Medicare Other | Source: Ambulatory Visit | Attending: Radiology | Admitting: Radiology

## 2018-01-06 DIAGNOSIS — E041 Nontoxic single thyroid nodule: Secondary | ICD-10-CM | POA: Insufficient documentation

## 2018-01-13 ENCOUNTER — Other Ambulatory Visit: Payer: Medicare Other

## 2018-02-09 ENCOUNTER — Other Ambulatory Visit: Payer: Self-pay | Admitting: Otolaryngology

## 2018-02-23 NOTE — Pre-Procedure Instructions (Signed)
Ana Chandler  02/23/2018      Your procedure is scheduled on March 04, 2018.  Report to [facility] Admitting at 08:00 A.M.  Call this number if you have problems the morning of surgery:  [phone]   Remember:  Do not eat or drink after midnight.    Take these medicines the morning of surgery with A SIP OF WATER : Amlodipine (Norvasc) Fluticasone (Flonase) Omeprazole (Prilosec)  Follow your doctor's instructions regarding your Asprin.  If no instructions were given, you will need to call the office to obtain instructions.    7 days prior to surgery STOP taking any Aspirin (unless otherwise instructed by your surgeon), Aleve, Naproxen, Ibuprofen, Motrin, Advil, Goody's, BC's, all herbal medications, fish oil, and all vitamins.     Do not wear jewelry, make-up or nail polish.  Do not wear lotions, powders, or perfumes, or deodorant.  Do not shave 48 hours prior to surgery.   Do not bring valuables to the hospital.  [facility] is not responsible for any belongings or valuables.  Contacts, dentures or bridgework may not be worn into surgery.  Leave your suitcase in the car.  After surgery it may be brought to your room.  For patients admitted to the hospital, discharge time will be determined by your treatment team.  Patients discharged the day of surgery will not be allowed to drive home.   Special instructions:   [facility]- Preparing For Surgery  Before surgery, you can play an important role. Because skin is not sterile, your skin needs to be as free of germs as possible. You can reduce the number of germs on your skin by washing with CHG (chlorahexidine gluconate) Soap before surgery.  CHG is an antiseptic cleaner which kills germs and bonds with the skin to continue killing germs even after washing.    Oral Hygiene is also important to reduce your risk of infection.  Remember - BRUSH YOUR TEETH THE MORNING OF SURGERY WITH YOUR REGULAR  TOOTHPASTE  Please do not use if you have an allergy to CHG or antibacterial soaps. If your skin becomes reddened/irritated stop using the CHG.  Do not shave (including legs and underarms) for at least 48 hours prior to first CHG shower. It is OK to shave your face.  Please follow these instructions carefully.   1. Shower the NIGHT BEFORE SURGERY and the MORNING OF SURGERY with CHG.   2. If you chose to wash your hair, wash your hair first as usual with your normal shampoo.  3. After you shampoo, rinse your hair and body thoroughly to remove the shampoo.  4. Use CHG as you would any other liquid soap. You can apply CHG directly to the skin and wash gently with a scrungie or a clean washcloth.   5. Apply the CHG Soap to your body ONLY FROM THE NECK DOWN.  Do not use on open wounds or open sores. Avoid contact with your eyes, ears, mouth and genitals (private parts). Wash Face and genitals (private parts)  with your normal soap.  6. Wash thoroughly, paying special attention to the area where your surgery will be performed.  7. Thoroughly rinse your body with warm water from the neck down.  8. DO NOT shower/wash with your normal soap after using and rinsing off the CHG Soap.  9. Pat yourself dry with a CLEAN TOWEL.  10. Wear CLEAN PAJAMAS to bed the night before surgery, wear comfortable clothes the  morning of surgery  11. Place CLEAN SHEETS on your bed the night of your first shower and DO NOT SLEEP WITH PETS.    Day of Surgery:  Do not apply any deodorants/lotions.  Please wear clean clothes to the hospital/surgery center.   Remember to brush your teeth WITH YOUR REGULAR TOOTHPASTE.    Please read over the following fact sheets that you were given.

## 2018-02-24 ENCOUNTER — Other Ambulatory Visit: Payer: Self-pay

## 2018-02-24 ENCOUNTER — Ambulatory Visit (HOSPITAL_COMMUNITY)
Admission: RE | Admit: 2018-02-24 | Discharge: 2018-02-24 | Disposition: A | Discharge status: Home / Self Care | Payer: Medicare Other | Source: Ambulatory Visit | Attending: Anesthesiology | Admitting: Anesthesiology

## 2018-02-24 ENCOUNTER — Encounter (HOSPITAL_COMMUNITY): Payer: Self-pay

## 2018-02-24 ENCOUNTER — Encounter (HOSPITAL_COMMUNITY)
Admission: RE | Admit: 2018-02-24 | Discharge: 2018-02-24 | Disposition: A | Discharge status: Home / Self Care | Payer: Medicare Other | Source: Ambulatory Visit | Attending: Otolaryngology | Admitting: Otolaryngology

## 2018-02-24 DIAGNOSIS — Z01818 Encounter for other preprocedural examination: Secondary | ICD-10-CM | POA: Insufficient documentation

## 2018-02-24 LAB — BASIC METABOLIC PANEL
Anion gap: 8 (ref 5–15)
BUN: 17 mg/dL (ref 8–23)
CO2: 27 mmol/L (ref 22–32)
Calcium: 9.6 mg/dL (ref 8.9–10.3)
Chloride: 106 mmol/L (ref 98–111)
Creatinine, Ser: 0.79 mg/dL (ref 0.44–1.00)
GFR calc Af Amer: >60 mL/min (ref >60)
GFR calc non Af Amer: >60 mL/min (ref >60)
GLUCOSE: 95 mg/dL (ref 70–99)
Potassium: 4 mmol/L (ref 3.5–5.1)
Sodium: 141 mmol/L (ref 135–145)

## 2018-02-24 LAB — CBC
HCT: 37.8 % (ref 36.0–46.0)
Hemoglobin: 10.9 g/dL — ABNORMAL LOW (ref 12.0–15.0)
MCH: 21.6 pg — ABNORMAL LOW (ref 26.0–34.0)
MCHC: 28.8 g/dL — ABNORMAL LOW (ref 30.0–36.0)
MCV: 74.9 fL — AB (ref 80.0–100.0)
Platelets: 328 10*3/uL (ref 150–400)
RBC: 5.05 MIL/uL (ref 3.87–5.11)
RDW: 19 % — ABNORMAL HIGH (ref 11.5–15.5)
WBC: 7.8 10*3/uL (ref 4.0–10.5)
nRBC: 0 % (ref 0.0–0.2)

## 2018-02-24 NOTE — Progress Notes (Signed)
[facility] [place], [place] Cardiologist - [facility] [place], [place] Pulmonologist- Dr. Cloria Spring- [place], [place]  Chest x-ray - 02/24/18 EKG - 02/24/18 Stress Test - denies ECHO - 2019- requested Cardiac Cath - 2019-requested  Sleep Study - 2019-requested CPAP - uses QHS- pressure settings 5-15.   Aspirin Instructions: patient advised to contact Dr. Redmond Baseman regarding her ASA. Patient instructed to hold all NSAID's, herbal medications, fish oil and vitamins 7 days prior to surgery.   Anesthesia review: cardiac history  Patient denies shortness of breath, fever, cough and chest pain at PAT appointment   Patient verbalized understanding of instructions that were given to them at the PAT appointment. Patient was also instructed that they will need to review over the PAT instructions again at home before surgery.

## 2018-02-25 ENCOUNTER — Other Ambulatory Visit (HOSPITAL_COMMUNITY): Payer: Medicare Other

## 2018-02-25 NOTE — Progress Notes (Signed)
Anesthesia Chart Review:  Case:  [id number] Date/Time:  03/04/18 0945   Procedure:  RIGHT THYROID LOBECTOMY (Right )   Anesthesia type:  General   Pre-op diagnosis:  thyroid nodules   Location:  [facility] OR ROOM 09 / [place] [facility]   Surgeon:  Melida Quitter, MD      DISCUSSION: 73 yo female never smoker. Pertinent hx includes HTN, DOE, LBBB.  Pt has undergone extensive workup for worsening DOE. She was evaluated by cardiologist at [facility] in [place]. Per Dr. Rodman Key Huffman's note 05/07/2017: "Continued symptoms which she says is severe with any exertion.  Extensive cardiac work-up done without clear etiology of symptoms.  Left heart cath 02/21/2017 with only mild disease.  LVEDP was 20 mmHg but BP not well controlled and no evidence of CHF.  Lungs clear on exam.  Echo 03/24/2017 reviewed independently today with normal LV systolic function and no major valvular disease.  Zio patch without sustained arrhythmias.  Will check chest x-ray and PFTs and refer to pulmonology."  Dr. Redmond Baseman' initial consult note 12/06/2017 in care everywhere also provides a review of workup for DOE: "One year ago, she started noticing a tight feeling in her chest and shortness of breath once in a while. She was evaluated by cardiology where a heart catheterization looked fine. She was prescribed amlodipine for hypertension. She was then evaluated by pulmonology who only found obstructive sleep apnea. She was prescribed CPAP. She has been tolerating it but does not feel any better. A chest CT was performed in September that demonstrated a thyroid nodule. Her shortness of breath is particularly a problem with exertion. The course has worsened and has become pretty severe with exertion.Marland KitchenMarland KitchenI reviewed her CT scan with her demonstrating a 3.2 cm right thyroid nodule that is causing slight compression of the trachea. I doubt that this degree of compression is causing her dyspnea. We will proceed with a thyroid ultrasound followed by an  ultrasound-guided FNA. I will call her with results. We may consider proceeding with right thyroid lobectomy even if pathology is benign due to her breathing problem."  Given her benign cardiology and pulmonology workup I anticipate she can proceed with surgery as planned.   VS: BP (!) 144/66   Pulse 72   Temp 36.4 C   Resp 20   Ht 5' (1.524 m)   Wt 75.3 kg   SpO2 98%   BMI 32.42 kg/m   PROVIDERS: Josem Kaufmann, MD is PCP  Robin Searing, MD is Cardiologist  Lorie Apley, MD is Pulmonologist  LABS: Labs reviewed: Acceptable for surgery. (all labs ordered are listed, but only abnormal results are displayed)  Labs Reviewed  CBC - Abnormal; Notable for the following components:      Result Value   Hemoglobin 10.9 (*)    MCV 74.9 (*)    MCH 21.6 (*)    MCHC 28.8 (*)    RDW 19.0 (*)    All other components within normal limits  BASIC METABOLIC PANEL     IMAGES: CHEST - 2 VIEW 02/24/18:  COMPARISON:  No recent prior.  FINDINGS: Mediastinum and hilar structures normal. Heart size normal. Lungs are clear. No pleural effusion. No pneumothorax. Degenerative change thoracic spine.  IMPRESSION: No acute cardiopulmonary disease.  EKG: 02/24/2018: Normal sinus rhythm with sinus arrhythmia. Rate 60. Left bundle branch block.  CV: TTE 04/01/2017 (outside record, copy on pt chart): Summary: 1.  Left ventricle: The cavity size is normal.  Wall thickness is moderately increased  in a pattern of concentric left ventricle hypertrophy.  Systolic function is normal.  The estimated ejection fraction was in the range of 55 to 65%.  Wall motion was normal; there were no regional wall motion abnormalities.  Diastolic dysfunction was in determinate. 2.  Mitral valve: There is mild regurgitation. 3.  Right ventricle: The cavity size is normal.  Systolic function is normal. 4.  Tricuspid valve: Mild regurgitation. 5.  Pulmonary arteries: Systolic pressure was within the normal  range, estimated to be 36 mmHg.  Cath 02/05/2017 (outside record, copy on pt chart): Impression: Mild coronary plaque disease.  No significant stenosis notified.  The left ventricular ejection fraction was 60%.  The overall left ventricular systolic function normal.  Mildly to moderately elevated left ventricular end-diastolic pressure, 20 mmHg.   Past Medical History:  Diagnosis Date  . Arthritis   . High cholesterol   . Hypertension     Past Surgical History:  Procedure Laterality Date  . ABDOMINAL HERNIA REPAIR  1995  . Bone Spur Removal    . BREAST BIOPSY Right   . CARDIAC CATHETERIZATION  2019  . CHOLECYSTECTOMY  2000  . COLONOSCOPY N/A 06/18/2014   Procedure: COLONOSCOPY;  Surgeon: Daneil Dolin, MD;  Location: [facility];  Service: Endoscopy;  Laterality: N/A;  1030   . EYE SURGERY Left 2019   Toric lens L eye  . HAND SURGERY Right 2015  . KNEE ARTHROSCOPY Right 2017  . PARTIAL HYSTERECTOMY  1977  . TUBAL LIGATION  1975    MEDICATIONS: . amLODipine (NORVASC) 5 MG tablet  . aspirin 81 MG tablet  . Calcium Carbonate-Vitamin D (CALCIUM + D PO)  . cholecalciferol (VITAMIN D) 1000 UNITS tablet  . estradiol (ESTRACE) 1 MG tablet  . fluticasone (FLONASE) 50 MCG/ACT nasal spray  . GLUCOSAMINE-CHONDROITIN PO  . Multiple Vitamins-Minerals (MULTIVITAMIN PO)  . naproxen sodium (ALEVE) 220 MG tablet  . omeprazole (PRILOSEC) 20 MG capsule  . Polyethylene Glycol 400 (BLINK TEARS) 0.25 % SOLN  . rosuvastatin (CRESTOR) 5 MG tablet   No current facility-administered medications for this encounter.     Wynonia Musty [facility] Short Stay Center/Anesthesiology Phone [phone] 02/28/2018 10:44 AM

## 2018-02-28 NOTE — Anesthesia Preprocedure Evaluation (Addendum)
Anesthesia Evaluation  Patient identified by MRN, date of birth, ID band Patient awake    Reviewed: Allergy & Precautions, NPO status , Patient's Chart, lab work & pertinent test results  Airway Mallampati: IV  TM Distance: >3 FB Neck ROM: Full    Dental no notable dental hx.    Pulmonary sleep apnea and Continuous Positive Airway Pressure Ventilation ,  Lorie Apley, MD is Pulmonologist   Pulmonary exam normal breath sounds clear to auscultation       Cardiovascular hypertension, Pt. on medications + DOE  Normal cardiovascular exam Rhythm:Regular Rate:Normal  ECG: rate 60. Normal sinus rhythm with sinus arrhythmia Left bundle branch block  Robin Searing, MD is Cardiologist   Neuro/Psych negative neurological ROS  negative psych ROS   GI/Hepatic Neg liver ROS, GERD  Medicated and Controlled,  Endo/Other  negative endocrine ROS  Renal/GU negative Renal ROS     Musculoskeletal negative musculoskeletal ROS (+)   Abdominal (+) + obese,   Peds  Hematology  (+) anemia , HLD   Anesthesia Other Findings thyroid nodules  Reproductive/Obstetrics                           Anesthesia Physical Anesthesia Plan  ASA: II  Anesthesia Plan: General   Post-op Pain Management:    Induction: Intravenous  PONV Risk Score and Plan: 3 and Ondansetron, Dexamethasone, Midazolam and Treatment may vary due to age or medical condition  Airway Management Planned: Oral ETT  Additional Equipment:   Intra-op Plan:   Post-operative Plan: Extubation in OR  Informed Consent: I have reviewed the patients History and Physical, chart, labs and discussed the procedure including the risks, benefits and alternatives for the proposed anesthesia with the patient or authorized representative who has indicated his/her understanding and acceptance.     Dental advisory given  Plan Discussed with:  CRNA  Anesthesia Plan Comments: (Reviewed PAT note by Karoline Caldwell, PA-C )       Anesthesia Quick Evaluation

## 2018-03-04 ENCOUNTER — Encounter (HOSPITAL_COMMUNITY): Payer: Self-pay | Admitting: Certified Registered"

## 2018-03-04 ENCOUNTER — Encounter (HOSPITAL_COMMUNITY)
Admission: RE | Disposition: A | Discharge status: Home / Self Care | Payer: Self-pay | Source: Home / Self Care | Attending: Otolaryngology

## 2018-03-04 ENCOUNTER — Ambulatory Visit (HOSPITAL_COMMUNITY): Payer: Medicare Other | Admitting: Physician Assistant

## 2018-03-04 ENCOUNTER — Ambulatory Visit (HOSPITAL_COMMUNITY): Payer: Medicare Other | Admitting: Anesthesiology

## 2018-03-04 ENCOUNTER — Other Ambulatory Visit: Payer: Self-pay

## 2018-03-04 ENCOUNTER — Observation Stay (HOSPITAL_COMMUNITY)
Admission: RE | Admit: 2018-03-04 | Discharge: 2018-03-05 | Disposition: A | Discharge status: Home / Self Care | Payer: Medicare Other | Attending: Otolaryngology | Admitting: Otolaryngology

## 2018-03-04 DIAGNOSIS — D34 Benign neoplasm of thyroid gland: Secondary | ICD-10-CM | POA: Diagnosis not present

## 2018-03-04 DIAGNOSIS — K219 Gastro-esophageal reflux disease without esophagitis: Secondary | ICD-10-CM | POA: Insufficient documentation

## 2018-03-04 DIAGNOSIS — Z79899 Other long term (current) drug therapy: Secondary | ICD-10-CM | POA: Insufficient documentation

## 2018-03-04 DIAGNOSIS — G4730 Sleep apnea, unspecified: Secondary | ICD-10-CM | POA: Diagnosis not present

## 2018-03-04 DIAGNOSIS — I10 Essential (primary) hypertension: Secondary | ICD-10-CM | POA: Insufficient documentation

## 2018-03-04 DIAGNOSIS — Z79890 Hormone replacement therapy (postmenopausal): Secondary | ICD-10-CM | POA: Diagnosis not present

## 2018-03-04 DIAGNOSIS — Z7951 Long term (current) use of inhaled steroids: Secondary | ICD-10-CM | POA: Insufficient documentation

## 2018-03-04 DIAGNOSIS — E785 Hyperlipidemia, unspecified: Secondary | ICD-10-CM | POA: Insufficient documentation

## 2018-03-04 DIAGNOSIS — E7800 Pure hypercholesterolemia, unspecified: Secondary | ICD-10-CM | POA: Insufficient documentation

## 2018-03-04 DIAGNOSIS — Z7982 Long term (current) use of aspirin: Secondary | ICD-10-CM | POA: Insufficient documentation

## 2018-03-04 DIAGNOSIS — M1990 Unspecified osteoarthritis, unspecified site: Secondary | ICD-10-CM | POA: Insufficient documentation

## 2018-03-04 DIAGNOSIS — E041 Nontoxic single thyroid nodule: Secondary | ICD-10-CM | POA: Diagnosis present

## 2018-03-04 HISTORY — PX: THYROIDECTOMY: SHX17

## 2018-03-04 HISTORY — PX: THYROID LOBECTOMY: SHX420

## 2018-03-04 SURGERY — THYROIDECTOMY
Anesthesia: General | Site: Neck | Laterality: Right

## 2018-03-04 MED ORDER — CALCIUM CARBONATE-VITAMIN D 500-200 MG-UNIT PO TABS
1.0000 | ORAL_TABLET | Freq: Two times a day (BID) | ORAL | Status: DC
  Administered 2018-03-04 – 2018-03-05 (×2): 1 via ORAL
  Filled 2018-03-04 (×2): qty 1

## 2018-03-04 MED ORDER — LACTATED RINGERS IV SOLN
INTRAVENOUS | Status: DC
  Administered 2018-03-04: 08:00:00 via INTRAVENOUS

## 2018-03-04 MED ORDER — LIDOCAINE-EPINEPHRINE 1 %-1:100000 IJ SOLN
INTRAMUSCULAR | Status: DC | PRN
  Administered 2018-03-04: 5 mL

## 2018-03-04 MED ORDER — KCL IN DEXTROSE-NACL 20-5-0.45 MEQ/L-%-% IV SOLN
INTRAVENOUS | Status: DC
  Administered 2018-03-04: 14:00:00 via INTRAVENOUS
  Filled 2018-03-04: qty 1000

## 2018-03-04 MED ORDER — ROCURONIUM BROMIDE 50 MG/5ML IV SOSY
PREFILLED_SYRINGE | INTRAVENOUS | Status: DC | PRN
  Administered 2018-03-04: 50 mg via INTRAVENOUS
  Administered 2018-03-04: 20 mg via INTRAVENOUS

## 2018-03-04 MED ORDER — MORPHINE SULFATE (PF) 2 MG/ML IV SOLN: 2.0000 mg | INTRAVENOUS | Status: DC | PRN

## 2018-03-04 MED ORDER — DEXAMETHASONE SODIUM PHOSPHATE 10 MG/ML IJ SOLN
INTRAMUSCULAR | Status: AC
  Filled 2018-03-04: qty 1

## 2018-03-04 MED ORDER — 0.9 % SODIUM CHLORIDE (POUR BTL) OPTIME
TOPICAL | Status: DC | PRN
  Administered 2018-03-04: 1000 mL

## 2018-03-04 MED ORDER — PROPOFOL 10 MG/ML IV BOLUS
INTRAVENOUS | Status: AC
  Filled 2018-03-04: qty 20

## 2018-03-04 MED ORDER — PROPOFOL 10 MG/ML IV BOLUS
INTRAVENOUS | Status: DC | PRN
  Administered 2018-03-04: 50 mg via INTRAVENOUS
  Administered 2018-03-04: 100 mg via INTRAVENOUS

## 2018-03-04 MED ORDER — ESTRADIOL 1 MG PO TABS: 1.0000 mg | ORAL_TABLET | ORAL | Status: DC

## 2018-03-04 MED ORDER — SODIUM CHLORIDE 0.9 % IV SOLN
INTRAVENOUS | Status: DC | PRN
  Administered 2018-03-04: 25 ug/min via INTRAVENOUS

## 2018-03-04 MED ORDER — SUGAMMADEX SODIUM 200 MG/2ML IV SOLN
INTRAVENOUS | Status: DC | PRN
  Administered 2018-03-04: 150 mg via INTRAVENOUS

## 2018-03-04 MED ORDER — ONDANSETRON HCL 4 MG/2ML IJ SOLN: 4.0000 mg | Freq: Once | INTRAMUSCULAR | Status: DC | PRN

## 2018-03-04 MED ORDER — ACETAMINOPHEN 325 MG PO TABS
650.0000 mg | ORAL_TABLET | Freq: Four times a day (QID) | ORAL | Status: DC | PRN
  Administered 2018-03-04 – 2018-03-05 (×2): 650 mg via ORAL
  Filled 2018-03-04 (×2): qty 2

## 2018-03-04 MED ORDER — PANTOPRAZOLE SODIUM 40 MG PO TBEC
40.0000 mg | DELAYED_RELEASE_TABLET | Freq: Every day | ORAL | Status: DC
  Administered 2018-03-05: 40 mg via ORAL
  Filled 2018-03-04: qty 1

## 2018-03-04 MED ORDER — ASPIRIN 81 MG PO CHEW
81.0000 mg | CHEWABLE_TABLET | Freq: Every day | ORAL | Status: DC
  Administered 2018-03-04 – 2018-03-05 (×2): 81 mg via ORAL
  Filled 2018-03-04 (×2): qty 1

## 2018-03-04 MED ORDER — ROSUVASTATIN CALCIUM 5 MG PO TABS
5.0000 mg | ORAL_TABLET | Freq: Every evening | ORAL | Status: DC
  Administered 2018-03-04: 5 mg via ORAL
  Filled 2018-03-04: qty 1

## 2018-03-04 MED ORDER — PHENYLEPHRINE 40 MCG/ML (10ML) SYRINGE FOR IV PUSH (FOR BLOOD PRESSURE SUPPORT)
PREFILLED_SYRINGE | INTRAVENOUS | Status: DC | PRN
  Administered 2018-03-04: 120 ug via INTRAVENOUS

## 2018-03-04 MED ORDER — HYDROCODONE-ACETAMINOPHEN 5-325 MG PO TABS: 1.0000 | ORAL_TABLET | ORAL | Status: DC | PRN

## 2018-03-04 MED ORDER — FENTANYL CITRATE (PF) 250 MCG/5ML IJ SOLN
INTRAMUSCULAR | Status: AC
  Filled 2018-03-04: qty 5

## 2018-03-04 MED ORDER — ROCURONIUM BROMIDE 50 MG/5ML IV SOSY
PREFILLED_SYRINGE | INTRAVENOUS | Status: AC
  Filled 2018-03-04: qty 5

## 2018-03-04 MED ORDER — CEFAZOLIN SODIUM-DEXTROSE 2-4 GM/100ML-% IV SOLN
2.0000 g | INTRAVENOUS | Status: AC
  Administered 2018-03-04: 2 g via INTRAVENOUS
  Filled 2018-03-04: qty 100

## 2018-03-04 MED ORDER — LIDOCAINE-EPINEPHRINE 1 %-1:100000 IJ SOLN
INTRAMUSCULAR | Status: AC
  Filled 2018-03-04: qty 1

## 2018-03-04 MED ORDER — FENTANYL CITRATE (PF) 100 MCG/2ML IJ SOLN
INTRAMUSCULAR | Status: AC
  Filled 2018-03-04: qty 2

## 2018-03-04 MED ORDER — POLYVINYL ALCOHOL 1.4 % OP SOLN
1.0000 [drp] | OPHTHALMIC | Status: DC
  Filled 2018-03-04: qty 15

## 2018-03-04 MED ORDER — ONDANSETRON HCL 4 MG/2ML IJ SOLN
INTRAMUSCULAR | Status: DC | PRN
  Administered 2018-03-04: 4 mg via INTRAVENOUS

## 2018-03-04 MED ORDER — FENTANYL CITRATE (PF) 100 MCG/2ML IJ SOLN
INTRAMUSCULAR | Status: DC | PRN
  Administered 2018-03-04: 50 ug via INTRAVENOUS
  Administered 2018-03-04: 150 ug via INTRAVENOUS
  Administered 2018-03-04: 50 ug via INTRAVENOUS

## 2018-03-04 MED ORDER — ONDANSETRON HCL 4 MG PO TABS: 4.0000 mg | ORAL_TABLET | ORAL | Status: DC | PRN

## 2018-03-04 MED ORDER — FENTANYL CITRATE (PF) 100 MCG/2ML IJ SOLN
25.0000 ug | INTRAMUSCULAR | Status: DC | PRN
  Administered 2018-03-04: 50 ug via INTRAVENOUS

## 2018-03-04 MED ORDER — MIDAZOLAM HCL 2 MG/2ML IJ SOLN
INTRAMUSCULAR | Status: DC | PRN
  Administered 2018-03-04 (×2): 1 mg via INTRAVENOUS

## 2018-03-04 MED ORDER — LIDOCAINE 2% (20 MG/ML) 5 ML SYRINGE
INTRAMUSCULAR | Status: AC
  Filled 2018-03-04: qty 5

## 2018-03-04 MED ORDER — HEMOSTATIC AGENTS (NO CHARGE) OPTIME
TOPICAL | Status: DC | PRN
  Administered 2018-03-04: 1 via TOPICAL

## 2018-03-04 MED ORDER — NAPROXEN SODIUM 275 MG PO TABS
275.0000 mg | ORAL_TABLET | Freq: Two times a day (BID) | ORAL | Status: DC | PRN
  Filled 2018-03-04: qty 1

## 2018-03-04 MED ORDER — SUGAMMADEX SODIUM 500 MG/5ML IV SOLN
INTRAVENOUS | Status: AC
  Filled 2018-03-04: qty 5

## 2018-03-04 MED ORDER — PHENYLEPHRINE 40 MCG/ML (10ML) SYRINGE FOR IV PUSH (FOR BLOOD PRESSURE SUPPORT)
PREFILLED_SYRINGE | INTRAVENOUS | Status: AC
  Filled 2018-03-04: qty 10

## 2018-03-04 MED ORDER — LIDOCAINE 2% (20 MG/ML) 5 ML SYRINGE
INTRAMUSCULAR | Status: DC | PRN
  Administered 2018-03-04: 80 mg via INTRAVENOUS

## 2018-03-04 MED ORDER — VITAMIN D 25 MCG (1000 UNIT) PO TABS
1000.0000 [IU] | ORAL_TABLET | Freq: Every day | ORAL | Status: DC
  Administered 2018-03-04 – 2018-03-05 (×2): 1000 [IU] via ORAL
  Filled 2018-03-04 (×2): qty 1

## 2018-03-04 MED ORDER — AMLODIPINE BESYLATE 2.5 MG PO TABS
2.5000 mg | ORAL_TABLET | Freq: Every day | ORAL | Status: DC
  Administered 2018-03-05: 2.5 mg via ORAL
  Filled 2018-03-04: qty 1

## 2018-03-04 MED ORDER — DEXAMETHASONE SODIUM PHOSPHATE 10 MG/ML IJ SOLN
INTRAMUSCULAR | Status: DC | PRN
  Administered 2018-03-04: 10 mg via INTRAVENOUS

## 2018-03-04 MED ORDER — ONDANSETRON HCL 4 MG/2ML IJ SOLN
INTRAMUSCULAR | Status: AC
  Filled 2018-03-04: qty 2

## 2018-03-04 MED ORDER — MIDAZOLAM HCL 2 MG/2ML IJ SOLN
INTRAMUSCULAR | Status: AC
  Filled 2018-03-04: qty 2

## 2018-03-04 MED ORDER — ONDANSETRON HCL 4 MG/2ML IJ SOLN: 4.0000 mg | INTRAMUSCULAR | Status: DC | PRN

## 2018-03-04 SURGICAL SUPPLY — 46 items
BLADE SURG 15 STRL LF DISP TIS (BLADE) ×1 IMPLANT
BLADE SURG 15 STRL SS (BLADE) ×1
CANISTER SUCT 3000ML PPV (MISCELLANEOUS) ×2 IMPLANT
CLEANER TIP ELECTROSURG 2X2 (MISCELLANEOUS) ×2 IMPLANT
CONT SPEC 4OZ CLIKSEAL STRL BL (MISCELLANEOUS) ×2 IMPLANT
CORD BIPOLAR FORCEPS 12FT (ELECTRODE) ×2 IMPLANT
COVER SURGICAL LIGHT HANDLE (MISCELLANEOUS) ×2 IMPLANT
DERMABOND ADVANCED (GAUZE/BANDAGES/DRESSINGS) ×1
DERMABOND ADVANCED .7 DNX12 (GAUZE/BANDAGES/DRESSINGS) ×1 IMPLANT
DRAIN JACKSON RD 7FR 3/32 (WOUND CARE) IMPLANT
ELECT COATED BLADE 2.86 ST (ELECTRODE) ×2 IMPLANT
ELECT REM PT RETURN 9FT ADLT (ELECTROSURGICAL) ×2
ELECTRODE REM PT RTRN 9FT ADLT (ELECTROSURGICAL) ×1 IMPLANT
EVACUATOR SILICONE 100CC (DRAIN) IMPLANT
FORCEPS BIPOLAR SPETZLER 8 1.0 (NEUROSURGERY SUPPLIES) ×2 IMPLANT
GAUZE 4X4 16PLY RFD (DISPOSABLE) ×2 IMPLANT
GLOVE BIO SURGEON STRL SZ 6.5 (GLOVE) ×2 IMPLANT
GLOVE BIO SURGEON STRL SZ7.5 (GLOVE) ×2 IMPLANT
GLOVE BIOGEL PI IND STRL 6.5 (GLOVE) ×1 IMPLANT
GLOVE BIOGEL PI IND STRL 7.0 (GLOVE) ×1 IMPLANT
GLOVE BIOGEL PI INDICATOR 6.5 (GLOVE) ×1
GLOVE BIOGEL PI INDICATOR 7.0 (GLOVE) ×1
GLOVE SURG SS PI 6.0 STRL IVOR (GLOVE) ×2 IMPLANT
GLOVE SURG SS PI 6.5 STRL IVOR (GLOVE) ×2 IMPLANT
GLOVE SURG SS PI 7.0 STRL IVOR (GLOVE) ×2 IMPLANT
GOWN STRL REUS W/ TWL LRG LVL3 (GOWN DISPOSABLE) ×4 IMPLANT
GOWN STRL REUS W/TWL LRG LVL3 (GOWN DISPOSABLE) ×4
HEMOSTAT SURGICEL 2X14 (HEMOSTASIS) ×2 IMPLANT
KIT BASIN OR (CUSTOM PROCEDURE TRAY) ×2 IMPLANT
KIT TURNOVER KIT B (KITS) ×2 IMPLANT
NEEDLE HYPO 25GX1X1/2 BEV (NEEDLE) ×4 IMPLANT
NS IRRIG 1000ML POUR BTL (IV SOLUTION) ×2 IMPLANT
PAD ARMBOARD 7.5X6 YLW CONV (MISCELLANEOUS) ×4 IMPLANT
PENCIL BUTTON HOLSTER BLD 10FT (ELECTRODE) ×2 IMPLANT
POSITIONER FOAM HEAD TRAC HOLE (MISCELLANEOUS) ×2 IMPLANT
SHEARS HARMONIC 9CM CVD (BLADE) ×2 IMPLANT
SPONGE INTESTINAL PEANUT (DISPOSABLE) ×2 IMPLANT
STAPLER VISISTAT 35W (STAPLE) ×2 IMPLANT
SUT ETHILON 2 0 FS 18 (SUTURE) IMPLANT
SUT SILK 3 0 REEL (SUTURE) ×2 IMPLANT
SUT VIC AB 3-0 SH 27 (SUTURE) ×1
SUT VIC AB 3-0 SH 27X BRD (SUTURE) ×1 IMPLANT
SUT VICRYL 4-0 PS2 18IN ABS (SUTURE) ×2 IMPLANT
TOWEL GREEN STERILE FF (TOWEL DISPOSABLE) ×2 IMPLANT
TRAY ENT MC OR (CUSTOM PROCEDURE TRAY) ×2 IMPLANT
TUBE FEEDING 10FR FLEXIFLO (MISCELLANEOUS) IMPLANT

## 2018-03-04 NOTE — Progress Notes (Signed)
   Subjective:    Patient ID: Ana Chandler, female    DOB: [birth year], 73 y.o.   MRN: [id number]  HPI Feels throat soreness and phlegm in throat.  Otherwise, doing well.  Review of Systems     Objective:   Physical Exam AF VSS Alert, NAD Normal voice Thyroid incision clean and intact, no fluid collection     Assessment & Plan:  Right thyroid lobectomy  Doing great.  Observe overnight.

## 2018-03-04 NOTE — Anesthesia Procedure Notes (Addendum)
Procedure Name: Intubation Date/Time: 03/04/2018 10:10 AM Performed by: Barrington Ellison, CRNA Pre-anesthesia Checklist: Patient identified, Emergency Drugs available, Suction available and Patient being monitored Patient Re-evaluated:Patient Re-evaluated prior to induction Oxygen Delivery Method: Circle System Utilized Preoxygenation: Pre-oxygenation with 100% oxygen Induction Type: IV induction Ventilation: Mask ventilation without difficulty Laryngoscope Size: Glidescope and 3 Grade View: Grade I Tube type: Oral Tube size: 7.0 mm Number of attempts: 2 Airway Equipment and Method: Stylet and Oral airway Placement Confirmation: ETT inserted through vocal cords under direct vision,  positive ETCO2 and breath sounds checked- equal and bilateral Secured at: 21 cm Tube secured with: Tape Dental Injury: Teeth and Oropharynx as per pre-operative assessment  Difficulty Due To: Difficulty was unanticipated and Difficult Airway- due to anterior larynx Future Recommendations: Recommend- induction with short-acting agent, and alternative techniques readily available Comments: DL x 1, Mac 3,  Grade 3 view, unable to angle ETT enough for insertion; DL x 1 with Glidescope  Grade 1 view

## 2018-03-04 NOTE — H&P (Signed)
Ana Chandler is an 73 y.o. female.   Chief Complaint: Thyroid nodule HPI: 73 year old female with sizeable right thyroid nodule that causes obstructive symptoms.  FNA was benign.  She presents for surgical management.  Past Medical History:  Diagnosis Date  . Arthritis   . High cholesterol   . Hypertension     Past Surgical History:  Procedure Laterality Date  . ABDOMINAL HERNIA REPAIR  1995  . Bone Spur Removal    . BREAST BIOPSY Right   . CARDIAC CATHETERIZATION  2019  . CHOLECYSTECTOMY  2000  . COLONOSCOPY N/A 06/18/2014   Procedure: COLONOSCOPY;  Surgeon: Daneil Dolin, MD;  [facility];  Service: Endoscopy;  Laterality: N/A;  1030   . EYE SURGERY Left 2019   Toric lens L eye  . HAND SURGERY Right 2015  . KNEE ARTHROSCOPY Right 2017  . PARTIAL HYSTERECTOMY  1977  . TUBAL LIGATION  1975    Family History  Problem Relation Age of Onset  . Hypercholesterolemia Mother   . COPD Mother   . Lung cancer Father   . Colon cancer Neg Hx   . Colon polyps Neg Hx    Social History:  reports that she has never smoked. She has never used smokeless tobacco. She reports current alcohol use. She reports that she does not use drugs.  Allergies:  Allergies  Allergen Reactions  . Lisinopril Other (See Comments)    Severe abdominal pain    Medications Prior to Admission  Medication Sig Dispense Refill  . amLODipine (NORVASC) 5 MG tablet Take 2.5 mg by mouth daily.    Marland Kitchen aspirin 81 MG tablet Take 81 mg by mouth daily.    . Calcium Carbonate-Vitamin D (CALCIUM + D PO) Take 1 tablet by mouth 2 (two) times daily.     . cholecalciferol (VITAMIN D) 1000 UNITS tablet Take 1,000 Units by mouth daily.     Marland Kitchen estradiol (ESTRACE) 1 MG tablet Take 1 mg by mouth 3 (three) times a week.     . fluticasone (FLONASE) 50 MCG/ACT nasal spray Place 1 spray into both nostrils daily.    Marland Kitchen GLUCOSAMINE-CHONDROITIN PO Take 1 tablet by mouth 2 (two) times daily.     . Multiple  Vitamins-Minerals (MULTIVITAMIN PO) Take 1 tablet by mouth daily.    . naproxen sodium (ALEVE) 220 MG tablet Take 220 mg by mouth 2 (two) times daily as needed (for pain or headache).    Marland Kitchen omeprazole (PRILOSEC) 20 MG capsule Take 20 mg by mouth daily.     . Polyethylene Glycol 400 (BLINK TEARS) 0.25 % SOLN Place 1 drop into the left eye every other day.    . rosuvastatin (CRESTOR) 5 MG tablet Take 5 mg by mouth daily.      No results found for this or any previous visit (from the past 48 hour(s)). No results found.  Review of Systems  Musculoskeletal: Positive for back pain.  All other systems reviewed and are negative.   Blood pressure (!) 160/60, pulse 74, temperature 97.7 F (36.5 C), temperature source Oral, resp. rate 20, height 5' (1.524 m), weight 72.6 kg, SpO2 98 %. Physical Exam   Assessment/Plan Right thyroid nodule To OR for right thyroid lobectomy.  Melida Quitter, MD 03/04/2018, 9:11 AM

## 2018-03-04 NOTE — Transfer of Care (Signed)
Immediate Anesthesia Transfer of Care Note  Patient: Ana Chandler  Procedure(s) Performed: RIGHT THYROID LOBECTOMY (Right Neck)  Patient Location: PACU  Anesthesia Type:General  Level of Consciousness: awake and oriented  Airway & Oxygen Therapy: Patient Spontanous Breathing and Patient connected to nasal cannula oxygen  Post-op Assessment: Report given to RN  Post vital signs: Reviewed and stable  Last Vitals:  Vitals Value Taken Time  BP 148/68 03/04/2018 11:33 AM  Temp    Pulse 93 03/04/2018 11:33 AM  Resp 13 03/04/2018 11:33 AM  SpO2 88 % 03/04/2018 11:33 AM  Vitals shown include unvalidated device data.  Last Pain:  Vitals:   03/04/18 0804  TempSrc: Oral         Complications: No apparent anesthesia complications

## 2018-03-04 NOTE — Brief Op Note (Signed)
03/04/2018  11:23 AM  PATIENT:  Ana Chandler  73 y.o. female  PRE-OPERATIVE DIAGNOSIS:  thyroid nodules  POST-OPERATIVE DIAGNOSIS:  thyroid nodules  PROCEDURE:  Procedure(s): RIGHT THYROID LOBECTOMY (Right)  SURGEON:  Surgeon(s) and Role:    Melida Quitter, MD - Primary  PHYSICIAN ASSISTANT: Sallee Provencal  ASSISTANTS: none   ANESTHESIA:   general  EBL:  30 mL   BLOOD ADMINISTERED:none  DRAINS: none   LOCAL MEDICATIONS USED:  LIDOCAINE   SPECIMEN:  Source of Specimen:  Right thyroid lobe  DISPOSITION OF SPECIMEN:  PATHOLOGY  COUNTS:  YES  TOURNIQUET:  * No tourniquets in log *  DICTATION: .Other Dictation: Dictation Number [id number]  PLAN OF CARE: Admit for overnight observation  PATIENT DISPOSITION:  PACU - hemodynamically stable.   Delay start of Pharmacological VTE agent (>24hrs) due to surgical blood loss or risk of bleeding: yes

## 2018-03-04 NOTE — Anesthesia Postprocedure Evaluation (Signed)
Anesthesia Post Note  Patient: Ana Chandler  Procedure(s) Performed: RIGHT THYROID LOBECTOMY (Right Neck)     Patient location during evaluation: PACU Anesthesia Type: General Level of consciousness: awake and alert Pain management: pain level controlled Vital Signs Assessment: post-procedure vital signs reviewed and stable Respiratory status: spontaneous breathing, nonlabored ventilation, respiratory function stable and patient connected to nasal cannula oxygen Cardiovascular status: blood pressure returned to baseline and stable Postop Assessment: no apparent nausea or vomiting Anesthetic complications: no    Last Vitals:  Vitals:   03/04/18 1225 03/04/18 1314  BP:  (!) 145/77  Pulse: 80 77  Resp: 16 17  Temp:  36.7 C  SpO2: 94% 99%    Last Pain:  Vitals:   03/04/18 1314  TempSrc: Oral  PainSc:                  Sairah Knobloch P Zylah Elsbernd

## 2018-03-05 DIAGNOSIS — D34 Benign neoplasm of thyroid gland: Secondary | ICD-10-CM | POA: Diagnosis not present

## 2018-03-05 NOTE — Discharge Summary (Signed)
Physician Discharge Summary  Patient ID: Ana Chandler MRN: [id number] DOB/AGE: [birth year] 73 y.o.  Admit date: 03/04/2018 Discharge date: 03/05/2018  Admission Diagnoses: Thyroid nodule  Discharge Diagnoses:  Active Problems:   Thyroid nodule   Discharged Condition: good  Hospital Course: 73 year old female with right-sided thyroid nodule and obstructive symptoms presented for thyroid lobectomy.  See operative note.  Observed overnight and did well.  Stable for discharge on POD 1.  Consults: None  Significant Diagnostic Studies: None  Treatments: surgery: Right thyroid lobectomy  Discharge Exam: Blood pressure 132/60, pulse 77, temperature 97.7 F (36.5 C), temperature source Oral, resp. rate 18, height 5' (1.524 m), weight 77 kg, SpO2 93 %. General appearance: alert, cooperative and no distress Neck: thyroid incision clean and intact, some adjacent edema, normal voice  Disposition: Discharge disposition: 01-Home or Self Care       Discharge Instructions    Diet - low sodium heart healthy   Complete by:  As directed    Discharge instructions   Complete by:  As directed    Avoid strenuous activity.  OK to allow incision to get wet, gently pat dry.  Do not apply ointment to the incision.  Tylenol and/or Aleve for pain.   Increase activity slowly   Complete by:  As directed      Allergies as of 03/05/2018      Reactions   Lisinopril Other (See Comments)   Severe abdominal pain      Medication List    TAKE these medications   amLODipine 5 MG tablet Commonly known as:  NORVASC Take 2.5 mg by mouth daily.   aspirin 81 MG tablet Take 81 mg by mouth daily.   BLINK TEARS 0.25 % Soln Generic drug:  Polyethylene Glycol 400 Place 1 drop into the left eye every other day.   CALCIUM + D PO Take 1 tablet by mouth 2 (two) times daily.   cholecalciferol 1000 units tablet Commonly known as:  VITAMIN D Take 1,000 Units by mouth daily.   estradiol 1 MG  tablet Commonly known as:  ESTRACE Take 1 mg by mouth 3 (three) times a week.   fluticasone 50 MCG/ACT nasal spray Commonly known as:  FLONASE Place 1 spray into both nostrils daily.   GLUCOSAMINE-CHONDROITIN PO Take 1 tablet by mouth 2 (two) times daily.   MULTIVITAMIN PO Take 1 tablet by mouth daily.   naproxen sodium 220 MG tablet Commonly known as:  ALEVE Take 220 mg by mouth 2 (two) times daily as needed (for pain or headache).   omeprazole 20 MG capsule Commonly known as:  PRILOSEC Take 20 mg by mouth daily.   rosuvastatin 5 MG tablet Commonly known as:  CRESTOR Take 5 mg by mouth daily.      Follow-up Information    Melida Quitter, MD. Schedule an appointment as soon as possible for a visit in 1 week.   Specialty:  Otolaryngology Contact information: [address] [place] [address] [phone]           Signed: Melida Quitter 03/05/2018, 8:56 AM

## 2018-03-05 NOTE — Plan of Care (Signed)
  Problem: Education: Goal: Knowledge of General Education information will improve Description Including pain rating scale, medication(s)/side effects and non-pharmacologic comfort measures Outcome: Progressing   

## 2018-03-05 NOTE — Progress Notes (Signed)
Patient discharged to home with instructions. 

## 2018-03-05 NOTE — Op Note (Signed)
NAME: Ana Chandler, Ana Chandler MEDICAL RECORD [id number] ACCOUNT [id number] DATE OF BIRTH:[birth year] FACILITY: [facility] LOCATION: [facility] PHYSICIAN:Avryl Roehm Guido Sander, MD  OPERATIVE REPORT  DATE OF PROCEDURE:  03/04/2018  PREOPERATIVE DIAGNOSIS:  Right thyroid nodule.  POSTOPERATIVE DIAGNOSIS:  Right thyroid nodule.  PROCEDURE:  Right thyroid lobectomy.  SURGEON:  Melida Quitter, MD  ANESTHESIA:  General endotracheal anesthesia.  COMPLICATIONS:  None.  INDICATIONS:  The patient is a 73 year old female who had a 3.2 cm right thyroid nodule noted on imaging.  Fine-needle aspiration was not concerning for cancer, but she feels an obstructed feeling with her breathing, and there is some compression of the  trachea.  She presents to the operating room for surgical management.  FINDINGS:  The right thyroid lobe was a little bit large with some nodularity and was sent for pathology.  DESCRIPTION OF PROCEDURE:  The patient was identified in the holding room, informed consent having been obtained, including discussion of risks, benefits and alternatives.  The patient was brought to the operative suite and put on the operative table in  supine position.  Anesthesia was induced, and the patient was intubated by the anesthesia team without difficulty.  The patient was given intravenous antibiotics during the case.  The eyes were taped closed and a shoulder roll was placed.  The neck  incision was marked with a marking pen and injected with 1% lidocaine with 1:100,000 epinephrine.  The neck was prepped and draped in sterile fashion.  Incision was made with a 15-blade scalpel through the skin and extended through the subcutaneous and  platysmal layer using Bovie electrocautery.  A self-retaining retractor was added.  The midline raphe of the strap muscles was divided and the right-sided strap muscles elevated over the thyroid lobe.  The lobe was then dissected around its periphery,  first with finger  dissection and then using the Harmonic scalpel.  This was first done up at the superior pole where initial dissection came into the gland a bit, so it was extended more superiorly across the pedicle.  The pole was then retracted  inferiorly and further dissection performed around the periphery of the lobe, freeing up the superior parathyroid gland, leaving it in place.  Continued dissection was then performed around the lateral extent of the gland and then around the inferior  extent, staying on the lobe and dissecting with the Harmonic scalpel.  The gland was then elevated from inferiorly and further dissection performed around the capsule of the gland up to the region of the recurrent laryngeal nerve where a small cuff of  thyroid tissue was left in place and the lobe removed.  The lobe was passed to nursing for pathology.  Bleeding was controlled with bipolar electrocautery.  The recurrent nerve was not seen during the case.  After copiously irrigated with saline, a  couple of pieces of Surgicel fabric were laid down into the thyroid bed.  The platysmal layer was closed with 3-0 Vicryl suture in a simple running fashion, and the subcutaneous layer was closed with 4-0 Vicryl suture in a simple interrupted fashion.   Dermabond was added to the skin.  Drapes were removed and the patient was cleaned off.  She returned to anesthesia for wakeup and was extubated while holding pressure against the surgical site.  She was moved to recovery room in stable condition.  LN/NUANCE  D:03/04/2018 T:03/05/2018 JOB:[id number]

## 2018-03-08 ENCOUNTER — Encounter (HOSPITAL_COMMUNITY): Payer: Self-pay | Admitting: Otolaryngology

## 2018-03-15 ENCOUNTER — Other Ambulatory Visit: Payer: Self-pay | Admitting: Obstetrics and Gynecology

## 2018-03-15 DIAGNOSIS — Z1231 Encounter for screening mammogram for malignant neoplasm of breast: Secondary | ICD-10-CM

## 2018-04-19 ENCOUNTER — Ambulatory Visit
Admission: RE | Admit: 2018-04-19 | Discharge: 2018-04-19 | Disposition: A | Discharge status: Home / Self Care | Payer: Medicare Other | Source: Ambulatory Visit | Attending: Obstetrics and Gynecology | Admitting: Obstetrics and Gynecology

## 2018-04-19 ENCOUNTER — Other Ambulatory Visit: Payer: Self-pay

## 2018-04-19 DIAGNOSIS — Z1231 Encounter for screening mammogram for malignant neoplasm of breast: Secondary | ICD-10-CM

## 2018-04-22 ENCOUNTER — Ambulatory Visit: Payer: Medicare Other

## 2019-03-31 ENCOUNTER — Other Ambulatory Visit: Payer: Self-pay | Admitting: Obstetrics and Gynecology

## 2019-03-31 DIAGNOSIS — Z1231 Encounter for screening mammogram for malignant neoplasm of breast: Secondary | ICD-10-CM

## 2019-04-28 ENCOUNTER — Other Ambulatory Visit: Payer: Self-pay

## 2019-04-28 ENCOUNTER — Encounter (HOSPITAL_COMMUNITY): Payer: Self-pay

## 2019-04-28 ENCOUNTER — Emergency Department (HOSPITAL_COMMUNITY)
Admission: EM | Admit: 2019-04-28 | Discharge: 2019-04-28 | Disposition: A | Discharge status: Home / Self Care | Payer: Medicare Other | Attending: Emergency Medicine | Admitting: Emergency Medicine

## 2019-04-28 DIAGNOSIS — R0789 Other chest pain: Secondary | ICD-10-CM | POA: Diagnosis not present

## 2019-04-28 DIAGNOSIS — T7840XA Allergy, unspecified, initial encounter: Secondary | ICD-10-CM | POA: Diagnosis present

## 2019-04-28 DIAGNOSIS — R202 Paresthesia of skin: Secondary | ICD-10-CM | POA: Diagnosis not present

## 2019-04-28 LAB — TROPONIN I (HIGH SENSITIVITY): Troponin I (High Sensitivity): 4 ng/L (ref <18)

## 2019-04-28 MED ORDER — EPINEPHRINE 0.3 MG/0.3ML IJ SOAJ: 0.3000 mg | Freq: Once | INTRAMUSCULAR | 2 refills | Status: AC

## 2019-04-28 NOTE — ED Triage Notes (Signed)
Pt got 2nd Moderna shot today and began having hives and oral swelling. Was given 125 mg Solumedrol as well as 50 of Benadryl. NAD. Pt's symptoms have resolved. Still having slight lip tingling.

## 2019-04-28 NOTE — ED Provider Notes (Addendum)
[facility] Provider Note   CSN: [id number] Arrival date & time: 04/28/19  1439     History Chief Complaint  Patient presents with  . Allergic Reaction    Ana Chandler is a 74 y.o. female.  Chief complaint allergic reaction.  Had second Covid Moderna shot today and then developed lip and tongue swelling and hives.  She was given Solu-Medrol 125 mg IV and Benadryl 50 mg IM.  Still with slight lip tingling, but good color and no airway compromise.  Severity of symptoms moderate.        Past Medical History:  Diagnosis Date  . Arthritis   . High cholesterol   . Hypertension     Patient Active Problem List   Diagnosis Date Noted  . Thyroid nodule 03/04/2018  . Diverticulosis of colon without hemorrhage   . Encounter for screening colonoscopy 06/11/2014    Past Surgical History:  Procedure Laterality Date  . ABDOMINAL HERNIA REPAIR  1995  . Bone Spur Removal    . BREAST BIOPSY Right   . CARDIAC CATHETERIZATION  2019  . CHOLECYSTECTOMY  2000  . COLONOSCOPY N/A 06/18/2014   Procedure: COLONOSCOPY;  Surgeon: Daneil Dolin, MD;  [facility]: [facility];  Service: Endoscopy;  Laterality: N/A;  1030   . EYE SURGERY Left 2019   Toric lens L eye  . HAND SURGERY Right 2015  . KNEE ARTHROSCOPY Right 2017  . PARTIAL HYSTERECTOMY  1977  . THYROID LOBECTOMY Right 03/04/2018  . THYROIDECTOMY Right 03/04/2018   Procedure: RIGHT THYROID LOBECTOMY;  Surgeon: Melida Quitter, MD;  Location: [place];  Service: ENT;  Laterality: Right;  . TUBAL LIGATION  1975     OB History   No obstetric history on file.     Family History  Problem Relation Age of Onset  . Hypercholesterolemia Mother   . COPD Mother   . Lung cancer Father   . Colon cancer Neg Hx   . Colon polyps Neg Hx     Social History   Tobacco Use  . Smoking status: Never Smoker  . Smokeless tobacco: Never Used  Substance Use Topics  . Alcohol use: Yes    Alcohol/week: 0.0  standard drinks    Comment: occasional, approximately 1 glass of wine a week  . Drug use: No    Home Medications Prior to Admission medications   Medication Sig Start Date End Date Taking? Authorizing Provider  amLODipine (NORVASC) 5 MG tablet Take 2.5 mg by mouth daily.    [provider]  aspirin 81 MG tablet Take 81 mg by mouth daily.    [provider]  Calcium Carbonate-Vitamin D (CALCIUM + D PO) Take 1 tablet by mouth 2 (two) times daily.     [provider]  cholecalciferol (VITAMIN D) 1000 UNITS tablet Take 1,000 Units by mouth daily.     [provider]  estradiol (ESTRACE) 1 MG tablet Take 1 mg by mouth 3 (three) times a week.  03/27/14   [provider]  fluticasone (FLONASE) 50 MCG/ACT nasal spray Place 1 spray into both nostrils daily.    [provider]  GLUCOSAMINE-CHONDROITIN PO Take 1 tablet by mouth 2 (two) times daily.     [provider]  Multiple Vitamins-Minerals (MULTIVITAMIN PO) Take 1 tablet by mouth daily.    [provider]  naproxen sodium (ALEVE) 220 MG tablet Take 220 mg by mouth 2 (two) times daily as needed (for pain or  headache).    [provider]  omeprazole (PRILOSEC) 20 MG capsule Take 20 mg by mouth daily.     [provider]  Polyethylene Glycol 400 (BLINK TEARS) 0.25 % SOLN Place 1 drop into the left eye every other day.    [provider]  rosuvastatin (CRESTOR) 5 MG tablet Take 5 mg by mouth daily.    [provider]    Allergies    Lisinopril and Losartan  Review of Systems   Review of Systems  All other systems reviewed and are negative.   Physical Exam Updated Vital Signs BP (!) 154/67 (BP Location: Left Arm)   Pulse 76   Temp 97.6 F (36.4 C) (Oral)   Resp 12   Ht 5' (1.524 m)   Wt 74.8 kg   SpO2 99%   BMI 32.22 kg/m   Physical Exam Vitals and nursing note reviewed.  Constitutional:      Appearance: She is  well-developed.     Comments: No respiratory distress.  HENT:     Head: Normocephalic and atraumatic.     Comments: She was able to stick out her tongue.  No obvious edema. Eyes:     Conjunctiva/sclera: Conjunctivae normal.  Cardiovascular:     Rate and Rhythm: Normal rate and regular rhythm.  Pulmonary:     Effort: Pulmonary effort is normal.     Breath sounds: Normal breath sounds.  Abdominal:     General: Bowel sounds are normal.     Palpations: Abdomen is soft.  Musculoskeletal:        General: Normal range of motion.     Cervical back: Neck supple.  Skin:    General: Skin is warm and dry.  Neurological:     General: No focal deficit present.     Mental Status: She is alert and oriented to person, place, and time.  Psychiatric:        Behavior: Behavior normal.     ED Results / Procedures / Treatments   Labs (all labs ordered are listed, but only abnormal results are displayed) Labs Reviewed - No data to display  EKG None  Radiology No results found.  Procedures Procedures (including critical care time)  Medications Ordered in ED Medications - No data to display  ED Course  I have reviewed the triage vital signs and the nursing notes.  Pertinent labs & imaging results that were available during my care of the patient were reviewed by me and considered in my medical decision making (see chart for details).    MDM Rules/Calculators/A&P                      Status post allergic phenomenon.  Patient is hemodynamically stable.  Will observe for a short period of time, then DC.  1800: Complaint of chest tightness.  This happened intermittently for 2 years.  She has been evaluated by cardiology.  No definitive answers.  Will obtain EKG and troponin. Final Clinical Impression(s) / ED Diagnoses Final diagnoses:  Allergic reaction, initial encounter    Rx / DC Orders ED Discharge Orders    None       Nat Christen, MD 04/28/19 1552    Nat Christen,  MD 04/28/19 [phone]

## 2019-04-28 NOTE — Discharge Instructions (Addendum)
Follow-up with your primary care doctor.  Prescription for EpiPen.  Can take Benadryl if needed

## 2019-06-05 ENCOUNTER — Encounter: Payer: Self-pay | Admitting: Gastroenterology

## 2019-06-13 ENCOUNTER — Ambulatory Visit: Payer: Medicare Other

## 2019-06-26 ENCOUNTER — Ambulatory Visit: Payer: Medicare Other | Admitting: Gastroenterology

## 2019-06-27 ENCOUNTER — Encounter: Payer: Self-pay | Admitting: Gastroenterology

## 2019-06-27 NOTE — Progress Notes (Addendum)
Primary Care Physician:  Josem Kaufmann, MD  Primary Gastroenterologist:  Garfield Cornea, MD   Chief Complaint  Patient presents with  . Anemia    has seen bright red blood in stool couple times about 1 month ago    HPI:  Ana Chandler is a 74 y.o. female here for further evaluation of anemia. Last seen in 2016 at time of colonoscopy. She had diverticulosis.   Hgb 11 in 01/2019 by PCP per patient. Husband has Alpha-Gal so she does not eat a lot of meat. Eating a lot of dark greens, chicken, fish. She complains of fatigue, DOE. Has been going back in forth between cardiology/pulmonology. Heart cath couple of years ago but may require another one in near future due to progressive symptoms. Cannot do house work or walk up a flight of steps. Heart monitor for two weeks, results pending. Breathing tests with pulmonologist. Had nuclear stress test which was ok. Some dizziness with going from sitting to standing. Gets chest pressure with exertion. Has to take a lot of breaks. Has an appointment next week at [facility] pulmonology with Dr. Daryel November.   Heartburn well controlled on omeprazole. No dysphagia. Some vague abdominal discomfort at times associated with weight gain due to DOE issues. BM usually 2-3 times per week is her normal. Occasional straining. No melena. Just a couple of episodes of brbpr with straining. Takes Aleve once daily. Has been on PPI for more than 5 years.   Labs from Jun 05, 2019: Creatinine 0.9, BUN 20 LFTs normal, TSH 3.66, white blood cell count 8100, hemoglobin 9, hematocrit 31.6, MCV 64, platelets 360,000.  Of note, review of epic showed hemoglobin of 10.9, MCV 74.9 back in January 2020.    Current Outpatient Medications  Medication Sig Dispense Refill  . aspirin 81 MG tablet Take 81 mg by mouth daily.    . Calcium Carbonate-Vitamin D (CALCIUM + D PO) Take 1 tablet by mouth 2 (two) times daily.     . cholecalciferol (VITAMIN D) 1000 UNITS tablet Take 1,000 Units by mouth  daily.     . fluticasone (FLONASE) 50 MCG/ACT nasal spray Place 1 spray into both nostrils daily.    Marland Kitchen GLUCOSAMINE-CHONDROITIN PO Take 1 tablet by mouth 2 (two) times daily.     . hydrochlorothiazide (HYDRODIURIL) 25 MG tablet Take 1 tablet by mouth daily.    . magnesium oxide (MAG-OX) 400 MG tablet Take 400 mg by mouth daily.    . Multiple Vitamins-Minerals (MULTIVITAMIN PO) Take 1 tablet by mouth daily.    . naproxen sodium (ALEVE) 220 MG tablet Take 220 mg by mouth 2 (two) times daily as needed (for pain or headache).    . NON FORMULARY Uses CPAP for sleep apnea    . omeprazole (PRILOSEC) 20 MG capsule Take 20 mg by mouth daily.     . Polyethylene Glycol 400 (BLINK TEARS) 0.25 % SOLN Place 1 drop into both eyes every other day.     . rosuvastatin (CRESTOR) 5 MG tablet Take 5 mg by mouth daily.     No current facility-administered medications for this visit.    Allergies as of 06/28/2019 - Review Complete 06/28/2019  Allergen Reaction Noted  . Lisinopril Other (See Comments) 06/18/2014  . Losartan  04/28/2019    Past Medical History:  Diagnosis Date  . Arthritis   . High cholesterol   . Hypertension   . Sleep apnea     Past Surgical History:  Procedure Laterality Date  .  ABDOMINAL HERNIA REPAIR  1995  . Bone Spur Removal    . BREAST BIOPSY Right   . CARDIAC CATHETERIZATION  2019  . CHOLECYSTECTOMY  2000  . COLONOSCOPY N/A 06/18/2014   [place]: diverticulosis  . EYE SURGERY Left 2019   Toric lens L eye  . HAND SURGERY Right 2015  . KNEE ARTHROSCOPY Right 2017  . PARTIAL HYSTERECTOMY  1977  . THYROID LOBECTOMY Right 03/04/2018  . THYROIDECTOMY Right 03/04/2018   Procedure: RIGHT THYROID LOBECTOMY;  Surgeon: Melida Quitter, MD;  Location: [place];  Service: ENT;  Laterality: Right;  . TUBAL LIGATION  1975    Family History  Problem Relation Age of Onset  . Hypercholesterolemia Mother   . COPD Mother   . Lung cancer Father   . Colon cancer Neg Hx   . Colon polyps Neg Hx      Social History   Socioeconomic History  . Marital status: Married    Spouse name: Not on file  . Number of children: Not on file  . Years of education: Not on file  . Highest education level: Not on file  Occupational History  . Not on file  Tobacco Use  . Smoking status: Never Smoker  . Smokeless tobacco: Never Used  Substance and Sexual Activity  . Alcohol use: Yes    Alcohol/week: 0.0 standard drinks    Comment: occ  . Drug use: No  . Sexual activity: Not on file  Other Topics Concern  . Not on file  Social History Narrative  . Not on file   Social Determinants of Health   Financial Resource Strain:   . Difficulty of Paying Living Expenses:   Food Insecurity:   . Worried About Charity fundraiser in the Last Year:   . Arboriculturist in the Last Year:   Transportation Needs:   . Film/video editor (Medical):   Marland Kitchen Lack of Transportation (Non-Medical):   Physical Activity:   . Days of Exercise per Week:   . Minutes of Exercise per Session:   Stress:   . Feeling of Stress :   Social Connections:   . Frequency of Communication with Friends and Family:   . Frequency of Social Gatherings with Friends and Family:   . Attends Religious Services:   . Active Member of Clubs or Organizations:   . Attends Archivist Meetings:   Marland Kitchen Marital Status:   Intimate Partner Violence:   . Fear of Current or Ex-Partner:   . Emotionally Abused:   Marland Kitchen Physically Abused:   . Sexually Abused:       ROS:  General: Negative for anorexia, weight loss, fever, chills, fatigue, weakness. Eyes: Negative for vision changes.  ENT: Negative for hoarseness, difficulty swallowing , nasal congestion. CV: Negative for chest pain, angina, palpitations, +dyspnea on exertion, peripheral edema.  Respiratory: Negative for dyspnea at rest, +dyspnea on exertion, cough, sputum, wheezing.  GI: See history of present illness. GU:  Negative for dysuria, hematuria, urinary incontinence,  urinary frequency, nocturnal urination.  MS: Positive for joint pain, low back pain.  Derm: Negative for rash or itching.  Neuro: Negative for weakness, abnormal sensation, seizure, frequent headaches, memory loss, confusion.  Psych: Negative for anxiety, depression, suicidal ideation, hallucinations.  Endo: Negative for unusual weight change.  Heme: Negative for bruising or bleeding. Allergy: Negative for rash or hives.    Physical Examination:  BP (!) 148/73   Pulse 66   Temp (!) 97.1 F (  36.2 C) (Temporal)   Ht 5' (1.524 m)   Wt 167 lb 6.4 oz (75.9 kg)   BMI 32.69 kg/m    General: Well-nourished, well-developed in no acute distress.  Head: Normocephalic, atraumatic.   Eyes: Conjunctiva pink, no icterus. Mouth: masked Neck: Supple without thyromegaly, masses, or lymphadenopathy.  Lungs: Clear to auscultation bilaterally.  Heart: Regular rate and rhythm, no murmurs rubs or gallops.  Abdomen: Bowel sounds are normal, nontender, nondistended, no hepatosplenomegaly or masses, no abdominal bruits or    hernia , no rebound or guarding.   Rectal: not performed Extremities: No lower extremity edema. No clubbing or deformities.  Neuro: Alert and oriented x 4 , grossly normal neurologically.  Skin: Warm and dry, no rash or jaundice.   Psych: Alert and cooperative, normal mood and affect.  Labs: Lab Results  Component Value Date   CREATININE 0.79 02/24/2018   BUN 17 02/24/2018   NA 141 02/24/2018   K 4.0 02/24/2018   CL 106 02/24/2018   CO2 27 02/24/2018   No results found for: ALT, AST, GGT, ALKPHOS, BILITOT Lab Results  Component Value Date   WBC 7.8 02/24/2018   HGB 10.9 (L) 02/24/2018   HCT 37.8 02/24/2018   MCV 74.9 (L) 02/24/2018   PLT 328 02/24/2018   No results found for: IRON, TIBC, FERRITIN No results found for: VITAMINB12 No results found for: FOLATE No results found for: ALT, AST, GGT, ALKPHOS, BILITOT   Imaging Studies: No results  found.  Impression/Plan:  74 y/o female with chronic GERD, progressive DOE/fatigue presenting for further evaluation of microcytic anemia. Review of Epic reveals microcytic anemia dating back to at least early 2020 and noted progressive over the past six months. She believes her anemia may in part be due to diminished oral intake of iron from food due to refraining from meat in the setting of husband's Alpha Gal disease. She has had very limited brbpr rarely with straining. She is on chronic Aleve for joint pain. Her last colonoscopy was in 2016.   Suspect IDA given progressive microcytic anemia, possibly nutritional reasons but need to exclude slow GI blood loss. Would offer her colonoscopy with EGD (screen for Barrett's and for anemia) in the near future but she is currently undergoing repeat evaluation with cardiology for worsening DOE. Some of her symptoms could be anemia related but would recommend complete cardiology evaluation prior to colonoscopy/EGD. In meantime will add oral iron. Check ifobt and update labs. Increase dietary iron.

## 2019-06-28 ENCOUNTER — Ambulatory Visit (INDEPENDENT_AMBULATORY_CARE_PROVIDER_SITE_OTHER): Payer: Medicare Other | Admitting: Gastroenterology

## 2019-06-28 ENCOUNTER — Ambulatory Visit
Admission: RE | Admit: 2019-06-28 | Discharge: 2019-06-28 | Disposition: A | Discharge status: Home / Self Care | Payer: Medicare Other | Source: Ambulatory Visit | Attending: Obstetrics and Gynecology | Admitting: Obstetrics and Gynecology

## 2019-06-28 ENCOUNTER — Other Ambulatory Visit: Payer: Self-pay

## 2019-06-28 ENCOUNTER — Encounter: Payer: Self-pay | Admitting: Gastroenterology

## 2019-06-28 DIAGNOSIS — D509 Iron deficiency anemia, unspecified: Secondary | ICD-10-CM | POA: Diagnosis not present

## 2019-06-28 DIAGNOSIS — Z1231 Encounter for screening mammogram for malignant neoplasm of breast: Secondary | ICD-10-CM

## 2019-06-28 DIAGNOSIS — K219 Gastro-esophageal reflux disease without esophagitis: Secondary | ICD-10-CM

## 2019-06-28 DIAGNOSIS — K625 Hemorrhage of anus and rectum: Secondary | ICD-10-CM | POA: Diagnosis not present

## 2019-06-28 NOTE — Patient Instructions (Addendum)
1. Please have labs done in 3 weeks or so.  2. Complete stool specimen and return to our office via mail. 3. I anticipate a colonoscopy in the near future for you; however, we need to wait for you to complete pending work up at [facility].  4. Start OTC iron supplement such as ferrous sulfate 325mg  twice per day. If you have worsening constipation, you should take OTC Miralax one capful once daily.    Iron-Rich Diet  Iron is a mineral that helps your body to produce hemoglobin. Hemoglobin is a protein in red blood cells that carries oxygen to your body's tissues. Eating too little iron may cause you to feel weak and tired, and it can increase your risk of infection. Iron is naturally found in many foods, and many foods have iron added to them (iron-fortified foods). You may need to follow an iron-rich diet if you do not have enough iron in your body due to certain medical conditions. The amount of iron that you need each day depends on your age, your sex, and any medical conditions you have. Follow instructions from your health care provider or a diet and nutrition specialist (dietitian) about how much iron you should eat each day. What are tips for following this plan? Reading food labels  Check food labels to see how many milligrams (mg) of iron are in each serving. Cooking  Cook foods in pots and pans that are made from iron.  Take these steps to make it easier for your body to absorb iron from certain foods: ? Soak beans overnight before cooking. ? Soak whole grains overnight and drain them before using. ? Ferment flours before baking, such as by using yeast in bread dough. Meal planning  When you eat foods that contain iron, you should eat them with foods that are high in vitamin C. These include oranges, peppers, tomatoes, potatoes, and mango. Vitamin C helps your body to absorb iron. General information  Take iron supplements only as told by your health care provider. An overdose of iron  can be life-threatening. If you were prescribed iron supplements, take them with orange juice or a vitamin C supplement.  When you eat iron-fortified foods or take an iron supplement, you should also eat foods that naturally contain iron, such as meat, poultry, and fish. Eating naturally iron-rich foods helps your body to absorb the iron that is added to other foods or contained in a supplement.  Certain foods and drinks prevent your body from absorbing iron properly. Avoid eating these foods in the same meal as iron-rich foods or with iron supplements. These foods include: ? Coffee, black tea, and red wine. ? Milk, dairy products, and foods that are high in calcium. ? Beans and soybeans. ? Whole grains. What foods should I eat? Fruits Prunes. Raisins. Eat fruits high in vitamin C, such as oranges, grapefruits, and strawberries, alongside iron-rich foods. Vegetables Spinach (cooked). Green peas. Broccoli. Fermented vegetables. Eat vegetables high in vitamin C, such as leafy greens, potatoes, bell peppers, and tomatoes, alongside iron-rich foods. Grains Iron-fortified breakfast cereal. Iron-fortified whole-wheat bread. Enriched rice. Sprouted grains. Meats and other proteins Beef liver. Oysters. Beef. Shrimp. [place]. Chicken. [place]. Sardines. Chickpeas. Nuts. Tofu. Pumpkin seeds. Beverages Tomato juice. Fresh orange juice. Prune juice. Hibiscus tea. Fortified instant breakfast shakes. Sweets and desserts Blackstrap molasses. Seasonings and condiments Tahini. Fermented soy sauce. Other foods Wheat germ. The items listed above may not be a complete list of recommended foods and beverages. Contact a  dietitian for more information. What foods should I avoid? Grains Whole grains. Bran cereal. Bran flour. Oats. Meats and other proteins Soybeans. Products made from soy protein. Black beans. Lentils. Mung beans. Split peas. Dairy Milk. Cream. Cheese. Yogurt. Cottage  cheese. Beverages Coffee. Black tea. Red wine. Sweets and desserts Cocoa. Chocolate. Ice cream. Other foods Basil. Oregano. Large amounts of parsley. The items listed above may not be a complete list of foods and beverages to avoid. Contact a dietitian for more information. Summary  Iron is a mineral that helps your body to produce hemoglobin. Hemoglobin is a protein in red blood cells that carries oxygen to your body's tissues.  Iron is naturally found in many foods, and many foods have iron added to them (iron-fortified foods).  When you eat foods that contain iron, you should eat them with foods that are high in vitamin C. Vitamin C helps your body to absorb iron.  Certain foods and drinks prevent your body from absorbing iron properly, such as whole grains and dairy products. You should avoid eating these foods in the same meal as iron-rich foods or with iron supplements. This information is not intended to replace advice given to you by your health care provider. Make sure you discuss any questions you have with your health care provider. Document Revised: 01/01/2017 Document Reviewed: 12/15/2016 Elsevier Patient Education  2020 [employer].

## 2019-06-29 ENCOUNTER — Ambulatory Visit: Payer: Medicare Other

## 2019-07-12 ENCOUNTER — Ambulatory Visit (INDEPENDENT_AMBULATORY_CARE_PROVIDER_SITE_OTHER): Payer: Self-pay

## 2019-07-12 ENCOUNTER — Other Ambulatory Visit: Payer: Self-pay

## 2019-07-12 ENCOUNTER — Telehealth: Payer: Self-pay

## 2019-07-12 DIAGNOSIS — D649 Anemia, unspecified: Secondary | ICD-10-CM

## 2019-07-12 LAB — IFOBT (OCCULT BLOOD): IFOBT: NEGATIVE

## 2019-07-12 NOTE — Telephone Encounter (Signed)
IFOBT was received. Results are negative. Routing to another provider in the Absence of LSL.

## 2019-07-12 NOTE — Telephone Encounter (Signed)
Noted. IFOBT is negative. LSL had also ordered labs for patient to complete at her last office visit. She needs to have these completed. Further recommendations when LSL returns.   Routing to LSL for [employer]

## 2019-07-13 NOTE — Telephone Encounter (Signed)
Spoke with pt. Pt notified of results and further recommendations to come by LSL. Pt is having labs done next week at her PCP's office.

## 2019-07-17 NOTE — Telephone Encounter (Signed)
Noted. Await labs from pcp

## 2019-08-14 ENCOUNTER — Telehealth: Payer: Self-pay | Admitting: *Deleted

## 2019-08-14 NOTE — Telephone Encounter (Signed)
Received VM from patient requesting to schedule procedure. She states she has been cleared by [facility]. We do not have orders on pt. Please advise

## 2019-08-15 ENCOUNTER — Telehealth: Payer: Self-pay | Admitting: Internal Medicine

## 2019-08-15 NOTE — Telephone Encounter (Signed)
See prior note. Message sent to [facility]

## 2019-08-15 NOTE — Telephone Encounter (Signed)
Pt called to say that she has been cleared from [facility] to schedule her colonoscopy. [phone]

## 2019-08-17 NOTE — Telephone Encounter (Signed)
Reviewed cardiology records in care everywhere.  Clearance for procedures have been approved.  Please let patient know that we would recommend a colonoscopy to further evaluate microcytic anemia.  Also would recommend upper endoscopy at the same time for both anemia and to screen for Barrett's esophagus given chronic GERD.  If patient is agreeable: - please schedule EGD/colonoscopy with Dr. Gala Romney in [place] with propofol -Please update her medication list, if any changes make me aware so that I can provide appropriate orders -ASA II

## 2019-08-18 NOTE — Telephone Encounter (Signed)
Called patient and made aware of below. She was agreeable. Only medication change was crestor is now 20mg  daily. She is aware will cal with Sept schedule with [facility] to schedule procedure

## 2019-08-22 NOTE — Telephone Encounter (Signed)
Noted. Ok to scheduled as per previous orders listed below.

## 2019-09-08 ENCOUNTER — Encounter: Payer: Self-pay | Admitting: *Deleted

## 2019-09-08 NOTE — Telephone Encounter (Signed)
Called pt. She is scheduled for TCS/EGD with propofol with Dr. Gala Romney. She is aware she will need pre-op/covid test prior. Advised will mail this to her with her prep instructions. Confirmed mailing address.

## 2019-09-25 ENCOUNTER — Telehealth: Payer: Self-pay | Admitting: Internal Medicine

## 2019-09-25 NOTE — Telephone Encounter (Signed)
Pt is scheduled to have procedure by Dr Gala Romney on 10/23/2019 and has questions about taking her iron. Please call [phone]

## 2019-09-25 NOTE — Telephone Encounter (Signed)
Called patient  And she states she takes iron 325 once a day (not on med list). Aware she needs to hold this x 1 week prior. She voiced understanding

## 2019-10-13 NOTE — Patient Instructions (Signed)
Ana Chandler  10/13/2019     @PREFPERIOPPHARMACY @   Your procedure is scheduled on  10/23/2019.  Report to [facility] at  [place].  Call this number if you have problems the morning of surgery:  [phone]   Remember:  Follow the diet and prep instructions given to you by the office.                     Take these medicines the morning of surgery with A SIP OF WATER  prilosec.    Do not wear jewelry, make-up or nail polish.  Do not wear lotions, powders, or perfumes,. Please wear deodorant and brush your teeth.  Do not shave 48 hours prior to surgery.  Men may shave face and neck.  Do not bring valuables to the hospital.  [facility] is not responsible for any belongings or valuables.  Contacts, dentures or bridgework may not be worn into surgery.  Leave your suitcase in the car.  After surgery it may be brought to your room.  For patients admitted to the hospital, discharge time will be determined by your treatment team.  Patients discharged the day of surgery will not be allowed to drive home.   Name and phone number of your driver:   family Special instructions:  DO NOT smoke the morning of your procedure.  Please read over the following fact sheets that you were given. Anesthesia Post-op Instructions and Care and Recovery After Surgery       Upper Endoscopy, Adult, Care After This sheet gives you information about how to care for yourself after your procedure. Your health care provider may also give you more specific instructions. If you have problems or questions, contact your health care provider. What can I expect after the procedure? After the procedure, it is common to have:  A sore throat.  Mild stomach pain or discomfort.  Bloating.  Nausea. Follow these instructions at home:   Follow instructions from your health care provider about what to eat or drink after your procedure.  Return to your normal activities as told by  your health care provider. Ask your health care provider what activities are safe for you.  Take over-the-counter and prescription medicines only as told by your health care provider.  Do not drive for 24 hours if you were given a sedative during your procedure.  Keep all follow-up visits as told by your health care provider. This is important. Contact a health care provider if you have:  A sore throat that lasts longer than one day.  Trouble swallowing. Get help right away if:  You vomit blood or your vomit looks like coffee grounds.  You have: ? A fever. ? Bloody, black, or tarry stools. ? A severe sore throat or you cannot swallow. ? Difficulty breathing. ? Severe pain in your chest or abdomen. Summary  After the procedure, it is common to have a sore throat, mild stomach discomfort, bloating, and nausea.  Do not drive for 24 hours if you were given a sedative during the procedure.  Follow instructions from your health care provider about what to eat or drink after your procedure.  Return to your normal activities as told by your health care provider. This information is not intended to replace advice given to you by your health care provider. Make sure you discuss any questions you have with your health care provider. Document Revised: 07/13/2017 Document  Reviewed: 06/21/2017 Elsevier Patient Education  [place].  Colonoscopy, Adult, Care After This sheet gives you information about how to care for yourself after your procedure. Your health care provider may also give you more specific instructions. If you have problems or questions, contact your health care provider. What can I expect after the procedure? After the procedure, it is common to have:  A small amount of blood in your stool for 24 hours after the procedure.  Some gas.  Mild cramping or bloating of your abdomen. Follow these instructions at home: Eating and drinking   Drink enough fluid to  keep your urine pale yellow.  Follow instructions from your health care provider about eating or drinking restrictions.  Resume your normal diet as instructed by your health care provider. Avoid heavy or fried foods that are hard to digest. Activity  Rest as told by your health care provider.  Avoid sitting for a long time without moving. Get up to take short walks every 1-2 hours. This is important to improve blood flow and breathing. Ask for help if you feel weak or unsteady.  Return to your normal activities as told by your health care provider. Ask your health care provider what activities are safe for you. Managing cramping and bloating   Try walking around when you have cramps or feel bloated.  Apply heat to your abdomen as told by your health care provider. Use the heat source that your health care provider recommends, such as a moist heat pack or a heating pad. ? Place a towel between your skin and the heat source. ? Leave the heat on for 20-30 minutes. ? Remove the heat if your skin turns bright red. This is especially important if you are unable to feel pain, heat, or cold. You may have a greater risk of getting burned. General instructions  For the first 24 hours after the procedure: ? Do not drive or use machinery. ? Do not sign important documents. ? Do not drink alcohol. ? Do your regular daily activities at a slower pace than normal. ? Eat soft foods that are easy to digest.  Take over-the-counter and prescription medicines only as told by your health care provider.  Keep all follow-up visits as told by your health care provider. This is important. Contact a health care provider if:  You have blood in your stool 2-3 days after the procedure. Get help right away if you have:  More than a small spotting of blood in your stool.  Large blood clots in your stool.  Swelling of your abdomen.  Nausea or vomiting.  A fever.  Increasing pain in your abdomen that  is not relieved with medicine. Summary  After the procedure, it is common to have a small amount of blood in your stool. You may also have mild cramping and bloating of your abdomen.  For the first 24 hours after the procedure, do not drive or use machinery, sign important documents, or drink alcohol.  Get help right away if you have a lot of blood in your stool, nausea or vomiting, a fever, or increased pain in your abdomen. This information is not intended to replace advice given to you by your health care provider. Make sure you discuss any questions you have with your health care provider. Document Revised: 08/15/2018 Document Reviewed: 08/15/2018 Elsevier Patient Education  [place] After These instructions provide you with information about caring for yourself after your procedure.  Your health care provider may also give you more specific instructions. Your treatment has been planned according to current medical practices, but problems sometimes occur. Call your health care provider if you have any problems or questions after your procedure. What can I expect after the procedure? After your procedure, you may:  Feel sleepy for several hours.  Feel clumsy and have poor balance for several hours.  Feel forgetful about what happened after the procedure.  Have poor judgment for several hours.  Feel nauseous or vomit.  Have a sore throat if you had a breathing tube during the procedure. Follow these instructions at home: For at least 24 hours after the procedure:      Have a responsible adult stay with you. It is important to have someone help care for you until you are awake and alert.  Rest as needed.  Do not: ? Participate in activities in which you could fall or become injured. ? Drive. ? Use heavy machinery. ? Drink alcohol. ? Take sleeping pills or medicines that cause drowsiness. ? Make important decisions or sign legal  documents. ? Take care of children on your own. Eating and drinking  Follow the diet that is recommended by your health care provider.  If you vomit, drink water, juice, or soup when you can drink without vomiting.  Make sure you have little or no nausea before eating solid foods. General instructions  Take over-the-counter and prescription medicines only as told by your health care provider.  If you have sleep apnea, surgery and certain medicines can increase your risk for breathing problems. Follow instructions from your health care provider about wearing your sleep device: ? Anytime you are sleeping, including during daytime naps. ? While taking prescription pain medicines, sleeping medicines, or medicines that make you drowsy.  If you smoke, do not smoke without supervision.  Keep all follow-up visits as told by your health care provider. This is important. Contact a health care provider if:  You keep feeling nauseous or you keep vomiting.  You feel light-headed.  You develop a rash.  You have a fever. Get help right away if:  You have trouble breathing. Summary  For several hours after your procedure, you may feel sleepy and have poor judgment.  Have a responsible adult stay with you for at least 24 hours or until you are awake and alert. This information is not intended to replace advice given to you by your health care provider. Make sure you discuss any questions you have with your health care provider. Document Revised: 04/19/2017 Document Reviewed: 05/12/2015 Elsevier Patient Education  [place].

## 2019-10-19 ENCOUNTER — Encounter (HOSPITAL_COMMUNITY): Payer: Self-pay

## 2019-10-19 ENCOUNTER — Other Ambulatory Visit: Payer: Self-pay

## 2019-10-19 ENCOUNTER — Encounter (HOSPITAL_COMMUNITY)
Admission: RE | Admit: 2019-10-19 | Discharge: 2019-10-19 | Disposition: A | Discharge status: Home / Self Care | Payer: Medicare Other | Source: Ambulatory Visit | Attending: Internal Medicine | Admitting: Internal Medicine

## 2019-10-19 ENCOUNTER — Other Ambulatory Visit (HOSPITAL_COMMUNITY)
Admission: RE | Admit: 2019-10-19 | Discharge: 2019-10-19 | Disposition: A | Discharge status: Home / Self Care | Payer: Medicare Other | Source: Ambulatory Visit | Attending: Internal Medicine | Admitting: Internal Medicine

## 2019-10-19 DIAGNOSIS — Z01812 Encounter for preprocedural laboratory examination: Secondary | ICD-10-CM | POA: Diagnosis present

## 2019-10-19 DIAGNOSIS — Z20822 Contact with and (suspected) exposure to covid-19: Secondary | ICD-10-CM | POA: Diagnosis not present

## 2019-10-19 LAB — CBC WITH DIFFERENTIAL/PLATELET
Abs Immature Granulocytes: 0.02 10*3/uL (ref 0.00–0.07)
Basophils Absolute: 0.1 10*3/uL (ref 0.0–0.1)
Basophils Relative: 1 %
Eosinophils Absolute: 0.3 10*3/uL (ref 0.0–0.5)
Eosinophils Relative: 4 %
HCT: 44.1 % (ref 36.0–46.0)
Hemoglobin: 13.2 g/dL (ref 12.0–15.0)
Immature Granulocytes: 0 %
Lymphocytes Relative: 33 %
Lymphs Abs: 2.3 10*3/uL (ref 0.7–4.0)
MCH: 23.5 pg — ABNORMAL LOW (ref 26.0–34.0)
MCHC: 29.9 g/dL — ABNORMAL LOW (ref 30.0–36.0)
MCV: 78.6 fL — ABNORMAL LOW (ref 80.0–100.0)
Monocytes Absolute: 0.6 10*3/uL (ref 0.1–1.0)
Monocytes Relative: 8 %
Neutro Abs: 3.9 10*3/uL (ref 1.7–7.7)
Neutrophils Relative %: 54 %
Platelets: 290 10*3/uL (ref 150–400)
RBC: 5.61 MIL/uL — ABNORMAL HIGH (ref 3.87–5.11)
RDW: 20 % — ABNORMAL HIGH (ref 11.5–15.5)
WBC: 7.1 10*3/uL (ref 4.0–10.5)
nRBC: 0 % (ref 0.0–0.2)

## 2019-10-19 LAB — BASIC METABOLIC PANEL
Anion gap: 9 (ref 5–15)
BUN: 15 mg/dL (ref 8–23)
CO2: 27 mmol/L (ref 22–32)
Calcium: 9.8 mg/dL (ref 8.9–10.3)
Chloride: 104 mmol/L (ref 98–111)
Creatinine, Ser: 0.82 mg/dL (ref 0.44–1.00)
GFR calc Af Amer: >60 mL/min (ref >60)
GFR calc non Af Amer: >60 mL/min (ref >60)
Glucose, Bld: 114 mg/dL — ABNORMAL HIGH (ref 70–99)
Potassium: 3.5 mmol/L (ref 3.5–5.1)
Sodium: 140 mmol/L (ref 135–145)

## 2019-10-20 LAB — SARS CORONAVIRUS 2 (TAT 6-24 HRS): SARS Coronavirus 2: NEGATIVE

## 2019-10-23 ENCOUNTER — Encounter (HOSPITAL_COMMUNITY): Payer: Self-pay | Admitting: Internal Medicine

## 2019-10-23 ENCOUNTER — Ambulatory Visit (HOSPITAL_COMMUNITY)
Admission: RE | Admit: 2019-10-23 | Discharge: 2019-10-23 | Disposition: A | Discharge status: Home / Self Care | Payer: Medicare Other | Attending: Internal Medicine | Admitting: Internal Medicine

## 2019-10-23 ENCOUNTER — Encounter (HOSPITAL_COMMUNITY)
Admission: RE | Disposition: A | Discharge status: Home / Self Care | Payer: Self-pay | Source: Home / Self Care | Attending: Internal Medicine

## 2019-10-23 ENCOUNTER — Other Ambulatory Visit: Payer: Self-pay

## 2019-10-23 ENCOUNTER — Ambulatory Visit (HOSPITAL_COMMUNITY): Payer: Medicare Other | Admitting: Anesthesiology

## 2019-10-23 DIAGNOSIS — Z79899 Other long term (current) drug therapy: Secondary | ICD-10-CM | POA: Insufficient documentation

## 2019-10-23 DIAGNOSIS — K449 Diaphragmatic hernia without obstruction or gangrene: Secondary | ICD-10-CM | POA: Diagnosis not present

## 2019-10-23 DIAGNOSIS — D120 Benign neoplasm of cecum: Secondary | ICD-10-CM | POA: Diagnosis not present

## 2019-10-23 DIAGNOSIS — Z9049 Acquired absence of other specified parts of digestive tract: Secondary | ICD-10-CM | POA: Diagnosis not present

## 2019-10-23 DIAGNOSIS — D649 Anemia, unspecified: Secondary | ICD-10-CM | POA: Diagnosis not present

## 2019-10-23 DIAGNOSIS — Z7982 Long term (current) use of aspirin: Secondary | ICD-10-CM | POA: Insufficient documentation

## 2019-10-23 DIAGNOSIS — M1990 Unspecified osteoarthritis, unspecified site: Secondary | ICD-10-CM | POA: Diagnosis not present

## 2019-10-23 DIAGNOSIS — K921 Melena: Secondary | ICD-10-CM | POA: Diagnosis present

## 2019-10-23 DIAGNOSIS — K317 Polyp of stomach and duodenum: Secondary | ICD-10-CM | POA: Diagnosis not present

## 2019-10-23 DIAGNOSIS — I10 Essential (primary) hypertension: Secondary | ICD-10-CM | POA: Diagnosis not present

## 2019-10-23 DIAGNOSIS — K5900 Constipation, unspecified: Secondary | ICD-10-CM | POA: Diagnosis not present

## 2019-10-23 DIAGNOSIS — R0602 Shortness of breath: Secondary | ICD-10-CM | POA: Diagnosis not present

## 2019-10-23 DIAGNOSIS — K5730 Diverticulosis of large intestine without perforation or abscess without bleeding: Secondary | ICD-10-CM | POA: Insufficient documentation

## 2019-10-23 DIAGNOSIS — Z90710 Acquired absence of both cervix and uterus: Secondary | ICD-10-CM | POA: Diagnosis not present

## 2019-10-23 DIAGNOSIS — D509 Iron deficiency anemia, unspecified: Secondary | ICD-10-CM | POA: Insufficient documentation

## 2019-10-23 DIAGNOSIS — E890 Postprocedural hypothyroidism: Secondary | ICD-10-CM | POA: Insufficient documentation

## 2019-10-23 DIAGNOSIS — G4730 Sleep apnea, unspecified: Secondary | ICD-10-CM | POA: Insufficient documentation

## 2019-10-23 DIAGNOSIS — Z8349 Family history of other endocrine, nutritional and metabolic diseases: Secondary | ICD-10-CM | POA: Insufficient documentation

## 2019-10-23 DIAGNOSIS — E7800 Pure hypercholesterolemia, unspecified: Secondary | ICD-10-CM | POA: Diagnosis not present

## 2019-10-23 DIAGNOSIS — Z825 Family history of asthma and other chronic lower respiratory diseases: Secondary | ICD-10-CM | POA: Insufficient documentation

## 2019-10-23 DIAGNOSIS — K219 Gastro-esophageal reflux disease without esophagitis: Secondary | ICD-10-CM | POA: Diagnosis not present

## 2019-10-23 DIAGNOSIS — K635 Polyp of colon: Secondary | ICD-10-CM | POA: Diagnosis not present

## 2019-10-23 DIAGNOSIS — Z888 Allergy status to other drugs, medicaments and biological substances status: Secondary | ICD-10-CM | POA: Diagnosis not present

## 2019-10-23 DIAGNOSIS — Z801 Family history of malignant neoplasm of trachea, bronchus and lung: Secondary | ICD-10-CM | POA: Insufficient documentation

## 2019-10-23 HISTORY — PX: BIOPSY: SHX5522

## 2019-10-23 HISTORY — PX: POLYPECTOMY: SHX5525

## 2019-10-23 HISTORY — PX: COLONOSCOPY WITH PROPOFOL: SHX5780

## 2019-10-23 HISTORY — PX: ESOPHAGOGASTRODUODENOSCOPY (EGD) WITH PROPOFOL: SHX5813

## 2019-10-23 SURGERY — COLONOSCOPY WITH PROPOFOL
Anesthesia: General

## 2019-10-23 MED ORDER — LIDOCAINE VISCOUS HCL 2 % MT SOLN
OROMUCOSAL | Status: AC
  Filled 2019-10-23: qty 15

## 2019-10-23 MED ORDER — GLYCOPYRROLATE 0.2 MG/ML IJ SOLN
0.2000 mg | Freq: Once | INTRAMUSCULAR | Status: AC
  Administered 2019-10-23: 0.2 mg via INTRAVENOUS

## 2019-10-23 MED ORDER — PROPOFOL 10 MG/ML IV BOLUS
INTRAVENOUS | Status: DC | PRN
  Administered 2019-10-23: 40 mg via INTRAVENOUS
  Administered 2019-10-23 (×3): 20 mg via INTRAVENOUS
  Administered 2019-10-23: 10 mg via INTRAVENOUS
  Administered 2019-10-23: 50 mg via INTRAVENOUS
  Administered 2019-10-23 (×2): 20 mg via INTRAVENOUS

## 2019-10-23 MED ORDER — LACTATED RINGERS IV SOLN: INTRAVENOUS | Status: DC | PRN

## 2019-10-23 MED ORDER — PROPOFOL 500 MG/50ML IV EMUL
INTRAVENOUS | Status: DC | PRN
  Administered 2019-10-23: 150 ug/kg/min via INTRAVENOUS

## 2019-10-23 MED ORDER — STERILE WATER FOR IRRIGATION IR SOLN
Status: DC | PRN
  Administered 2019-10-23: 1.5 mL

## 2019-10-23 MED ORDER — FENTANYL CITRATE (PF) 100 MCG/2ML IJ SOLN
25.0000 ug | Freq: Once | INTRAMUSCULAR | Status: AC
  Administered 2019-10-23: 25 ug via INTRAVENOUS

## 2019-10-23 MED ORDER — SIMETHICONE 80 MG PO CHEW: 80.0000 mg | CHEWABLE_TABLET | Freq: Four times a day (QID) | ORAL | Status: DC | PRN

## 2019-10-23 MED ORDER — GLYCOPYRROLATE 0.2 MG/ML IJ SOLN
INTRAMUSCULAR | Status: AC
  Filled 2019-10-23: qty 1

## 2019-10-23 MED ORDER — SIMETHICONE 40 MG/0.6ML PO SUSP
ORAL | Status: AC
  Filled 2019-10-23: qty 0.6

## 2019-10-23 MED ORDER — LIDOCAINE VISCOUS HCL 2 % MT SOLN
15.0000 mL | Freq: Once | OROMUCOSAL | Status: AC
  Administered 2019-10-23: 15 mL via OROMUCOSAL

## 2019-10-23 MED ORDER — LACTATED RINGERS IV SOLN: Freq: Once | INTRAVENOUS | Status: AC

## 2019-10-23 MED ORDER — PROPOFOL 10 MG/ML IV BOLUS
INTRAVENOUS | Status: AC
  Filled 2019-10-23: qty 60

## 2019-10-23 MED ORDER — FENTANYL CITRATE (PF) 100 MCG/2ML IJ SOLN
INTRAMUSCULAR | Status: AC
  Filled 2019-10-23: qty 2

## 2019-10-23 NOTE — Op Note (Signed)
[facility] Patient Name: Ana Chandler Procedure Date: 10/23/2019 10:33 AM MRN: [id number] Date of Birth: 28-Nov-1945 Attending MD: Norvel Richards , MD CSN: [id number] Age: 74 Admit Type: Outpatient Procedure:                Upper GI endoscopy Indications:              Epigastric abdominal pain Providers:                Norvel Richards, MD, Lambert Mody, Starla Link RN, RN, Raphael Gibney, Technician Referring MD:              Medicines:                Propofol per Anesthesia Complications:            No immediate complications. Estimated Blood Loss:     Estimated blood loss was minimal. Procedure:                Pre-Anesthesia Assessment:                           - Prior to the procedure, a History and Physical                            was performed, and patient medications and                            allergies were reviewed. The patient's tolerance of                            previous anesthesia was also reviewed. The risks                            and benefits of the procedure and the sedation                            options and risks were discussed with the patient.                            All questions were answered, and informed consent                            was obtained. ASA Grade Assessment: II - A patient                            with mild systemic disease. After reviewing the                            risks and benefits, the patient was deemed in                            satisfactory condition to undergo the procedure.  After obtaining informed consent, the endoscope was                            passed under direct vision. Throughout the                            procedure, the patient's blood pressure, pulse, and                            oxygen saturations were monitored continuously. The                            [id number]-H190 ([id number]) scope was introduced through  the                            mouth, and advanced to the second part of duodenum.                            The upper GI endoscopy was accomplished without                            difficulty. The patient tolerated the procedure                            well. Scope In: 10:57:39 AM Scope Out: 11:09:33 AM Total Procedure Duration: 0 hours 11 minutes 54 seconds  Findings:      The examined esophagus was normal.      A small hiatal hernia was present. Patient had a 1.2 cm pedunculated       polyp on a relatively long, thick pedicle in the antrum. Surface was       ulcerated. It had overlying clot; was actually oozing blood during the       examination. See photos. Remainder the gastric mucosa appeared normal.       Base of polyp was clip x2. Polyp was removed entirely with 1 pass of hot       snare cautery. Good hemostasis maintained. Finally, mucosal biopsies       were taken to assess for H. pylori.      The duodenal bulb and second portion of the duodenum were normal. Impression:               - Normal esophagus.                           - Small hiatal hernia. Gastric polyp with active                            hemorrhage status post snare polypectomy/site                            clipping. Status post gastric mucosal biopsy.                           - Normal duodenal bulb and second portion of the  duodenum. Moderate Sedation:      Moderate (conscious) sedation was personally administered by an       anesthesia professional. The following parameters were monitored: oxygen       saturation, heart rate, blood pressure, respiratory rate, EKG, adequacy       of pulmonary ventilation, and response to care. Recommendation:           - Patient has a contact number available for                            emergencies. The signs and symptoms of potential                            delayed complications were discussed with the                             patient. Return to normal activities tomorrow.                            Written discharge instructions were provided to the                            patient.                           - Advance diet as tolerated. Follow-up on                            pathology. No MRI until clips gone. See colonoscopy                            report. Procedure Code(s):        --- Professional ---                           [phone], Esophagogastroduodenoscopy, flexible,                            transoral; diagnostic, including collection of                            specimen(s) by brushing or washing, when performed                            (separate procedure) Diagnosis Code(s):        --- Professional ---                           K44.9, Diaphragmatic hernia without obstruction or                            gangrene                           R10.13, Epigastric pain CPT copyright 2019 American Medical Association. All rights reserved. The codes documented in this report are preliminary and upon coder review may  be revised to meet  current compliance requirements. Cristopher Estimable. Tali Cleaves, MD Norvel Richards, MD 10/23/2019 11:34:10 AM This report has been signed electronically. Number of Addenda: 0

## 2019-10-23 NOTE — Progress Notes (Signed)
Dr Sydell Axon has come to bedside to evaluate pt. Would like to add 53mcg fentanyl X1 for pain.will place order

## 2019-10-23 NOTE — Anesthesia Preprocedure Evaluation (Addendum)
Anesthesia Evaluation  Patient identified by MRN, date of birth, ID band Patient awake    Reviewed: Allergy & Precautions, NPO status , Patient's Chart, lab work & pertinent test results  History of Anesthesia Complications Negative for: history of anesthetic complications  Airway Mallampati: IV  TM Distance: >3 FB Neck ROM: Full    Dental  (+) Dental Advisory Given, Teeth Intact   Pulmonary shortness of breath and with exertion, sleep apnea and Continuous Positive Airway Pressure Ventilation ,    Pulmonary exam normal breath sounds clear to auscultation       Cardiovascular hypertension, Pt. on medications Normal cardiovascular exam Rhythm:Regular Rate:Normal     Neuro/Psych negative neurological ROS  negative psych ROS   GI/Hepatic Neg liver ROS, GERD  Medicated,  Endo/Other  negative endocrine ROS  Renal/GU negative Renal ROS     Musculoskeletal  (+) Arthritis , Osteoarthritis,    Abdominal   Peds  Hematology  (+) anemia ,   Anesthesia Other Findings   Reproductive/Obstetrics                          Anesthesia Physical Anesthesia Plan  ASA: III  Anesthesia Plan: General   Post-op Pain Management:    Induction: Intravenous  PONV Risk Score and Plan: TIVA  Airway Management Planned: Nasal Cannula and Natural Airway  Additional Equipment:   Intra-op Plan:   Post-operative Plan:   Informed Consent: I have reviewed the patients History and Physical, chart, labs and discussed the procedure including the risks, benefits and alternatives for the proposed anesthesia with the patient or authorized representative who has indicated his/her understanding and acceptance.     Dental advisory given  Plan Discussed with: CRNA and Surgeon  Anesthesia Plan Comments:        Anesthesia Quick Evaluation

## 2019-10-23 NOTE — Progress Notes (Signed)
MD called to check on pt- pain is 7/50 after mylicon and 25 mcg of fentanyl. abd soft. MD states to keep another 30 min but can progres to postop.pt aware.

## 2019-10-23 NOTE — H&P (Signed)
@LOGO @   Primary Care Physician:  Josem Kaufmann, MD Primary Gastroenterologist:  Dr. Gala Romney  Pre-Procedure History & Physical: HPI:  Ana Chandler is a 74 y.o. female here for further evaluation of microcytic anemia abdominal pain intermittent rectal bleeding via EGD and colonoscopy. Patient chronically constipated.  No dysphagia.  Past Medical History:  Diagnosis Date   Arthritis    Dyspnea 2018   High cholesterol    Hypertension    Sleep apnea     Past Surgical History:  Procedure Laterality Date   ABDOMINAL HERNIA REPAIR  1995   Bone Spur Removal     BREAST BIOPSY Right    CARDIAC CATHETERIZATION  2019   CHOLECYSTECTOMY  2000   COLONOSCOPY N/A 06/18/2014   Freedom Lopezperez: diverticulosis   EYE SURGERY Left 2019   Toric lens L eye   HAND SURGERY Right 2015   KNEE ARTHROSCOPY Right 2017   PARTIAL HYSTERECTOMY  1977   THYROID LOBECTOMY Right 03/04/2018   THYROIDECTOMY Right 03/04/2018   Procedure: RIGHT THYROID LOBECTOMY;  Surgeon: Melida Quitter, MD;  Location: [place];  Service: ENT;  Laterality: Right;   [place]    Prior to Admission medications   Medication Sig Start Date End Date Taking? Authorizing Provider  aspirin 81 MG tablet Take 81 mg by mouth daily.   Yes [provider]  Calcium Carbonate-Vitamin D (CALCIUM + D PO) Take 1 tablet by mouth 2 (two) times daily.    Yes [provider]  cholecalciferol (VITAMIN D) 1000 UNITS tablet Take 1,000 Units by mouth daily.    Yes [provider]  ferrous sulfate 325 (65 FE) MG tablet Take 325 mg by mouth daily with breakfast.   Yes [provider]  fexofenadine (ALLEGRA) 180 MG tablet Take 180 mg by mouth daily as needed for allergies or rhinitis.   Yes [provider]  fluticasone (FLONASE) 50 MCG/ACT nasal spray Place 1 spray into both nostrils daily.   Yes [provider]  GLUCOSAMINE-CHONDROITIN PO Take 1,500 mg by mouth 2 (two) times  daily.    Yes [provider]  hydrochlorothiazide (HYDRODIURIL) 25 MG tablet Take 25 mg by mouth daily.  06/05/19  Yes [provider]  magnesium oxide (MAG-OX) 400 MG tablet Take 400 mg by mouth daily.   Yes [provider]  Multiple Vitamins-Minerals (MULTIVITAMIN PO) Take 1 tablet by mouth daily.   Yes [provider]  naproxen sodium (ALEVE) 220 MG tablet Take 220 mg by mouth 2 (two) times daily as needed (for pain or headache).   Yes [provider]  omeprazole (PRILOSEC) 20 MG capsule Take 20 mg by mouth daily.    Yes [provider]  rosuvastatin (CRESTOR) 10 MG tablet Take 10 mg by mouth daily. 09/07/19  Yes [provider]  NON FORMULARY Uses CPAP for sleep apnea    [provider]  Polyethylene Glycol 400 (BLINK TEARS) 0.25 % SOLN Place 1 drop into both eyes every other day.  Patient not taking: Reported on 10/12/2019    [provider]  Rosuvastatin Calcium 20 MG CPSP Take 20 mg by mouth daily.  Patient not taking: Reported on 10/12/2019    [provider]    Allergies as of 09/08/2019 - Review Complete 06/28/2019  Allergen Reaction Noted   Lisinopril Other (See Comments) 06/18/2014   Losartan  04/28/2019    Family History  Problem Relation Age of Onset   Hypercholesterolemia Mother  COPD Mother    Lung cancer Father    Colon cancer Neg Hx    Colon polyps Neg Hx    Celiac disease Neg Hx    Inflammatory bowel disease Neg Hx     Social History   Socioeconomic History   Marital status: Married    Spouse name: Not on file   Number of children: Not on file   Years of education: Not on file   Highest education level: Not on file  Occupational History   Not on file  Tobacco Use   Smoking status: Never Smoker   Smokeless tobacco: Never Used  Vaping Use   Vaping Use: Never used  Substance and Sexual Activity   Alcohol use: Yes    Alcohol/week: 0.0 standard drinks     Comment: occ   Drug use: No   Sexual activity: Not on file  Other Topics Concern   Not on file  Social History Narrative   Not on file   Social Determinants of Health   Financial Resource Strain:    Difficulty of Paying Living Expenses: Not on file  Food Insecurity:    Worried About [place] in the Last Year: Not on file   Ran Out of Food in the Last Year: Not on file  Transportation Needs:    Lack of Transportation (Medical): Not on file   Lack of Transportation (Non-Medical): Not on file  Physical Activity:    Days of Exercise per Week: Not on file   Minutes of Exercise per Session: Not on file  Stress:    Feeling of Stress : Not on file  Social Connections:    Frequency of Communication with Friends and Family: Not on file   Frequency of Social Gatherings with Friends and Family: Not on file   Attends Religious Services: Not on file   Active Member of Clubs or Organizations: Not on file   Attends Archivist Meetings: Not on file   Marital Status: Not on file  Intimate Partner Violence:    Fear of Current or Ex-Partner: Not on file   Emotionally Abused: Not on file   Physically Abused: Not on file   Sexually Abused: Not on file    Review of Systems: See HPI, otherwise negative ROS  Physical Exam: BP (!) 143/74    Pulse 70    Temp 98.2 F (36.8 C)    Resp 20    SpO2 95%  General:   Alert,  Well-developed, well-nourished, pleasant and cooperative in NAD Mouth:  No deformity or lesions. Neck:  Supple; no masses or thyromegaly. No significant cervical adenopathy. Lungs:  Clear throughout to auscultation.   No wheezes, crackles, or rhonchi. No acute distress. Heart:  Regular rate and rhythm; no murmurs, clicks, rubs,  or gallops. Abdomen: Non-distended, normal bowel sounds.  Soft and nontender without appreciable mass or hepatosplenomegaly.  Pulses:  Normal pulses noted. Extremities:  Without clubbing or  edema.  Impression/Plan: 74 year old lady with vague abdominal pain intermittent paper hematochezia in the setting of constipation.  Microcytic anemia but normal H&H recently.  No dysphagia.  NSAID use.  I have offered the patient a diagnostic EGD and colonoscopy today per plan The risks, benefits, limitations, imponderables and alternatives regarding both EGD and colonoscopy have been reviewed with the patient. Questions have been answered. All parties agreeable.      Notice: This dictation was prepared with Dragon dictation along with smaller phrase technology. Any transcriptional errors that result from  this process are unintentional and may not be corrected upon review.

## 2019-10-23 NOTE — Progress Notes (Addendum)
Spoke with Dr Sydell Axon re pts lower abd pain 4/10- states to give mylicon and watch for a while longer.  RN Will place order.

## 2019-10-23 NOTE — Discharge Instructions (Signed)
Colonoscopy Discharge Instructions  Read the instructions outlined below and refer to this sheet in the next few weeks. These discharge instructions provide you with general information on caring for yourself after you leave the hospital. Your doctor may also give you specific instructions. While your treatment has been planned according to the most current medical practices available, unavoidable complications occasionally occur. If you have any problems or questions after discharge, call Dr. Gala Romney at [phone]. ACTIVITY  You may resume your regular activity, but move at a slower pace for the next 24 hours.   Take frequent rest periods for the next 24 hours.   Walking will help get rid of the air and reduce the bloated feeling in your belly (abdomen).   No driving for 24 hours (because of the medicine (anesthesia) used during the test).    Do not sign any important legal documents or operate any machinery for 24 hours (because of the anesthesia used during the test).  NUTRITION  Drink plenty of fluids.   You may resume your normal diet as instructed by your doctor.   Begin with a light meal and progress to your normal diet. Heavy or fried foods are harder to digest and may make you feel sick to your stomach (nauseated).   Avoid alcoholic beverages for 24 hours or as instructed.  MEDICATIONS  You may resume your normal medications unless your doctor tells you otherwise.  WHAT YOU CAN EXPECT TODAY  Some feelings of bloating in the abdomen.   Passage of more gas than usual.   Spotting of blood in your stool or on the toilet paper.  IF YOU HAD POLYPS REMOVED DURING THE COLONOSCOPY:  No aspirin products for 7 days or as instructed.   No alcohol for 7 days or as instructed.   Eat a soft diet for the next 24 hours.  FINDING OUT THE RESULTS OF YOUR TEST Not all test results are available during your visit. If your test results are not back during the visit, make an appointment  with your caregiver to find out the results. Do not assume everything is normal if you have not heard from your caregiver or the medical facility. It is important for you to follow up on all of your test results.  SEEK IMMEDIATE MEDICAL ATTENTION IF:  You have more than a spotting of blood in your stool.   Your belly is swollen (abdominal distention).   You are nauseated or vomiting.   You have a temperature over 101.   You have abdominal pain or discomfort that is severe or gets worse throughout the day.    EGD Discharge instructions Please read the instructions outlined below and refer to this sheet in the next few weeks. These discharge instructions provide you with general information on caring for yourself after you leave the hospital. Your doctor may also give you specific instructions. While your treatment has been planned according to the most current medical practices available, unavoidable complications occasionally occur. If you have any problems or questions after discharge, please call your doctor. ACTIVITY  You may resume your regular activity but move at a slower pace for the next 24 hours.   Take frequent rest periods for the next 24 hours.   Walking will help expel (get rid of) the air and reduce the bloated feeling in your abdomen.   No driving for 24 hours (because of the anesthesia (medicine) used during the test).   You may shower.   Do not sign  any important legal documents or operate any machinery for 24 hours (because of the anesthesia used during the test).  NUTRITION  Drink plenty of fluids.   You may resume your normal diet.   Begin with a light meal and progress to your normal diet.   Avoid alcoholic beverages for 24 hours or as instructed by your caregiver.  MEDICATIONS  You may resume your normal medications unless your caregiver tells you otherwise.  WHAT YOU CAN EXPECT TODAY  You may experience abdominal discomfort such as a feeling of  fullness or gas pains.  FOLLOW-UP  Your doctor will discuss the results of your test with you.  SEEK IMMEDIATE MEDICAL ATTENTION IF ANY OF THE FOLLOWING OCCUR:  Excessive nausea (feeling sick to your stomach) and/or vomiting.   Severe abdominal pain and distention (swelling).   Trouble swallowing.   Temperature over 101 F (37.8 C).   Rectal bleeding or vomiting of blood.   1 polyp removed from your stomach.  It was bleeding.  2 polyps removed from your colon.  Likely rectal bleeding from constipation  Begin Benefiber 1 tablespoon daily; use MiraLAX 1 capful in 8 ounces of water nightly as needed for constipation  Further recommendations to follow pending review of pathology report  Patient request, I called Olen Pel at [phone] -got voicemail and left a message with findings and recommendations  No MRI until clips gone.     Monitored Anesthesia Care, Care After These instructions provide you with information about caring for yourself after your procedure. Your health care provider may also give you more specific instructions. Your treatment has been planned according to current medical practices, but problems sometimes occur. Call your health care provider if you have any problems or questions after your procedure. What can I expect after the procedure? After your procedure, you may:  Feel sleepy for several hours.  Feel clumsy and have poor balance for several hours.  Feel forgetful about what happened after the procedure.  Have poor judgment for several hours.  Feel nauseous or vomit.  Have a sore throat if you had a breathing tube during the procedure. Follow these instructions at home: For at least 24 hours after the procedure:      Have a responsible adult stay with you. It is important to have someone help care for you until you are awake and alert.  Rest as needed.  Do not: ? Participate in activities in which you could fall or become  injured. ? Drive. ? Use heavy machinery. ? Drink alcohol. ? Take sleeping pills or medicines that cause drowsiness. ? Make important decisions or sign legal documents. ? Take care of children on your own. Eating and drinking  Follow the diet that is recommended by your health care provider.  If you vomit, drink water, juice, or soup when you can drink without vomiting.  Make sure you have little or no nausea before eating solid foods. General instructions  Take over-the-counter and prescription medicines only as told by your health care provider.  If you have sleep apnea, surgery and certain medicines can increase your risk for breathing problems. Follow instructions from your health care provider about wearing your sleep device: ? Anytime you are sleeping, including during daytime naps. ? While taking prescription pain medicines, sleeping medicines, or medicines that make you drowsy.  If you smoke, do not smoke without supervision.  Keep all follow-up visits as told by your health care provider. This is important. Contact a health care  provider if:  You keep feeling nauseous or you keep vomiting.  You feel light-headed.  You develop a rash.  You have a fever. Get help right away if:  You have trouble breathing. Summary  For several hours after your procedure, you may feel sleepy and have poor judgment.  Have a responsible adult stay with you for at least 24 hours or until you are awake and alert. This information is not intended to replace advice given to you by your health care provider. Make sure you discuss any questions you have with your health care provider. Document Revised: 04/19/2017 Document Reviewed: 05/12/2015 Elsevier Patient Education  [place].

## 2019-10-23 NOTE — Op Note (Addendum)
[facility] Patient Name: Ana Chandler Procedure Date: 10/23/2019 11:13 AM MRN: [id number] Date of Birth: 19-Jan-1946 Attending MD: Norvel Richards , MD CSN: [id number] Age: 74 Admit Type: Outpatient Procedure:                Colonoscopy Indications:              Hematochezia Providers:                Norvel Richards, MD, Lambert Mody, Starla Link RN, RN, Raphael Gibney, Technician Referring MD:              Medicines:                Propofol per Anesthesia Complications:            No immediate complications. Estimated Blood Loss:     Estimated blood loss was minimal. Procedure:                Pre-Anesthesia Assessment:                           - Prior to the procedure, a History and Physical                            was performed, and patient medications and                            allergies were reviewed. The patient's tolerance of                            previous anesthesia was also reviewed. The risks                            and benefits of the procedure and the sedation                            options and risks were discussed with the patient.                            All questions were answered, and informed consent                            was obtained. ASA Grade Assessment: II - A patient                            with mild systemic disease. After reviewing the                            risks and benefits, the patient was deemed in                            satisfactory condition to undergo the procedure.  After obtaining informed consent, the colonoscope                            was passed under direct vision. Throughout the                            procedure, the patient's blood pressure, pulse, and                            oxygen saturations were monitored continuously. The                            CF-[id number] ([id number]) scope was introduced through                             the anus and advanced to the the cecum, identified                            by appendiceal orifice and ileocecal valve. The                            colonoscopy was performed without difficulty. The                            patient tolerated the procedure well. The quality                            of the bowel preparation was adequate. Scope In: 11:16:24 AM Scope Out: 11:41:13 AM Scope Withdrawal Time: 0 hours 16 minutes 16 seconds  Total Procedure Duration: 0 hours 24 minutes 49 seconds  Findings:      The perianal and digital rectal examinations were normal.      Scattered small and large-mouthed diverticula were found in the entire       colon.      Two sessile polyps were found in the cecum. The polyps were 3 to 5 mm in       size. These polyps were removed with a cold snare. Resection and       retrieval were complete. Estimated blood loss was minimal.      The exam was otherwise without abnormality. Rectal vault small. Too       small to retroflex. Rectal mucosa seen well on face. Impression:               - Diverticulosis in the entire examined colon.                           - Two 3 to 5 mm polyps in the cecum, removed with a                            cold snare. Resected and retrieved.                           - The examination was otherwise normal. I suspect  benign anorectal bleeding in the setting of chronic                            constipation Moderate Sedation:      Moderate (conscious) sedation was administered by the endoscopy nurse       and supervised by the endoscopist. The following parameters were       monitored: oxygen saturation, heart rate, blood pressure, respiratory       rate, EKG, adequacy of pulmonary ventilation, and response to care. Recommendation:           - Patient has a contact number available for                            emergencies. The signs and symptoms of potential                             delayed complications were discussed with the                            patient. Return to normal activities tomorrow.                            Written discharge instructions were provided to the                            patient.                           - Resume previous diet.                           - Continue present medications.                           - Repeat colonoscopy after studies are complete for                            surveillance.                           - Return to GI office after studies are complete.                            Begin Benefiber 1 tablespoon daily. Add MiraLAX 17                            g orally at bedtime as needed for constipation. See                            EGD report Procedure Code(s):        --- Professional ---                           [phone], Colonoscopy, flexible; with removal of  tumor(s), polyp(s), or other lesion(s) by snare                            technique Diagnosis Code(s):        --- Professional ---                           K63.5, Polyp of colon                           K92.1, Melena (includes Hematochezia)                           K57.30, Diverticulosis of large intestine without                            perforation or abscess without bleeding CPT copyright 2019 American Medical Association. All rights reserved. The codes documented in this report are preliminary and upon coder review may  be revised to meet current compliance requirements. Ana Chandler. Lucylle Foulkes, MD Norvel Richards, MD 10/23/2019 11:37:41 AM This report has been signed electronically. Number of Addenda: 0

## 2019-10-23 NOTE — Transfer of Care (Signed)
Immediate Anesthesia Transfer of Care Note  Patient: Ana Chandler  Procedure(s) Performed: COLONOSCOPY WITH PROPOFOL (N/A ) ESOPHAGOGASTRODUODENOSCOPY (EGD) WITH PROPOFOL (N/A ) POLYPECTOMY BIOPSY  Patient Location: PACU  Anesthesia Type:General  Level of Consciousness: awake  Airway & Oxygen Therapy: Patient Spontanous Breathing  Post-op Assessment: Report given to RN  Post vital signs: Reviewed  Last Vitals:  Vitals Value Taken Time  BP 93/76 10/23/19 1138  Temp    Pulse 79 10/23/19 1139  Resp 20 10/23/19 1139  SpO2 97 % 10/23/19 1139  Vitals shown include unvalidated device data.  Last Pain:  Vitals:   10/23/19 1053  PainSc: 0-No pain         Complications: No complications documented.

## 2019-10-23 NOTE — Anesthesia Postprocedure Evaluation (Signed)
Anesthesia Post Note  Patient: Ana Chandler  Procedure(s) Performed: COLONOSCOPY WITH PROPOFOL (N/A ) ESOPHAGOGASTRODUODENOSCOPY (EGD) WITH PROPOFOL (N/A ) POLYPECTOMY BIOPSY  Patient location during evaluation: PACU Anesthesia Type: General Level of consciousness: awake and alert and oriented Pain management: pain level controlled Vital Signs Assessment: post-procedure vital signs reviewed and stable Respiratory status: spontaneous breathing Cardiovascular status: blood pressure returned to baseline and stable Postop Assessment: no apparent nausea or vomiting Anesthetic complications: no   No complications documented.   Last Vitals:  Vitals:   10/23/19 1000 10/23/19 1138  BP: (!) 143/74 93/76  Pulse: 70 80  Resp: 20 15  Temp:    SpO2: 95% 98%    Last Pain:  Vitals:   10/23/19 1053  PainSc: 0-No pain                 Ana Chandler

## 2019-10-24 ENCOUNTER — Encounter: Payer: Self-pay | Admitting: Internal Medicine

## 2019-10-24 ENCOUNTER — Telehealth: Payer: Self-pay

## 2019-10-24 LAB — SURGICAL PATHOLOGY

## 2019-10-24 NOTE — Telephone Encounter (Signed)
Letter mailed to the pt. 

## 2019-10-24 NOTE — Telephone Encounter (Signed)
Per Dr.Rourk- Send letter to patient.  Send copy of letter with path to referring provider and PCP.   OV with APP may be 6 to 8 weeks if not already scheduled

## 2019-10-25 ENCOUNTER — Encounter: Payer: Self-pay | Admitting: Internal Medicine

## 2019-10-25 NOTE — Telephone Encounter (Signed)
OV made and letter mailed °

## 2019-10-26 ENCOUNTER — Encounter (HOSPITAL_COMMUNITY): Payer: Self-pay | Admitting: Internal Medicine

## 2019-12-18 ENCOUNTER — Other Ambulatory Visit: Payer: Self-pay

## 2019-12-18 ENCOUNTER — Ambulatory Visit: Payer: Medicare Other | Admitting: Gastroenterology

## 2019-12-18 ENCOUNTER — Encounter: Payer: Self-pay | Admitting: Gastroenterology

## 2019-12-18 ENCOUNTER — Ambulatory Visit (INDEPENDENT_AMBULATORY_CARE_PROVIDER_SITE_OTHER): Payer: Medicare Other | Admitting: Gastroenterology

## 2019-12-18 DIAGNOSIS — K317 Polyp of stomach and duodenum: Secondary | ICD-10-CM | POA: Diagnosis not present

## 2019-12-18 DIAGNOSIS — D500 Iron deficiency anemia secondary to blood loss (chronic): Secondary | ICD-10-CM | POA: Diagnosis not present

## 2019-12-18 DIAGNOSIS — D509 Iron deficiency anemia, unspecified: Secondary | ICD-10-CM | POA: Insufficient documentation

## 2019-12-18 DIAGNOSIS — R143 Flatulence: Secondary | ICD-10-CM | POA: Diagnosis not present

## 2019-12-18 NOTE — Patient Instructions (Signed)
Iron Deficiency Anemia: Follow-up with your PCP for lab work next month.  Please have results faxed to our office at [phone].  Continue to increase iron in your diet since you are unable to tolerate ferrous sulfate.  We will see you back in 6 months.  Excessive gas/bloating: Use Gas-X or Beano as per package label as needed.  To optimize her digestive health, consider adding a probiotic such as Hardin Negus colon health, Align, or Restora.  These are the preferred choices however if you cannot find them in your local pharmacy, pick from any of the numerous choices available.  Look at the handout below, limit high FODMAP foods which produce more gas.     Iron-Rich Diet  Iron is a mineral that helps your body to produce hemoglobin. Hemoglobin is a protein in red blood cells that carries oxygen to your body's tissues. Eating too little iron may cause you to feel weak and tired, and it can increase your risk of infection. Iron is naturally found in many foods, and many foods have iron added to them (iron-fortified foods). You may need to follow an iron-rich diet if you do not have enough iron in your body due to certain medical conditions. The amount of iron that you need each day depends on your age, your sex, and any medical conditions you have. Follow instructions from your health care provider or a diet and nutrition specialist (dietitian) about how much iron you should eat each day. What are tips for following this plan? Reading food labels  Check food labels to see how many milligrams (mg) of iron are in each serving. Cooking  Cook foods in pots and pans that are made from iron.  Take these steps to make it easier for your body to absorb iron from certain foods: ? Soak beans overnight before cooking. ? Soak whole grains overnight and drain them before using. ? Ferment flours before baking, such as by using yeast in bread dough. Meal planning  When you eat foods that contain iron, you should  eat them with foods that are high in vitamin C. These include oranges, peppers, tomatoes, potatoes, and mango. Vitamin C helps your body to absorb iron. General information  Take iron supplements only as told by your health care provider. An overdose of iron can be life-threatening. If you were prescribed iron supplements, take them with orange juice or a vitamin C supplement.  When you eat iron-fortified foods or take an iron supplement, you should also eat foods that naturally contain iron, such as meat, poultry, and fish. Eating naturally iron-rich foods helps your body to absorb the iron that is added to other foods or contained in a supplement.  Certain foods and drinks prevent your body from absorbing iron properly. Avoid eating these foods in the same meal as iron-rich foods or with iron supplements. These foods include: ? Coffee, black tea, and red wine. ? Milk, dairy products, and foods that are high in calcium. ? Beans and soybeans. ? Whole grains. What foods should I eat? Fruits Prunes. Raisins. Eat fruits high in vitamin C, such as oranges, grapefruits, and strawberries, alongside iron-rich foods. Vegetables Spinach (cooked). Green peas. Broccoli. Fermented vegetables. Eat vegetables high in vitamin C, such as leafy greens, potatoes, bell peppers, and tomatoes, alongside iron-rich foods. Grains Iron-fortified breakfast cereal. Iron-fortified whole-wheat bread. Enriched rice. Sprouted grains. Meats and other proteins Beef liver. Oysters. Beef. Shrimp. [place]. Chicken. [place]. Sardines. Chickpeas. Nuts. Tofu. Pumpkin seeds. Beverages Tomato juice. Fresh orange  juice. Prune juice. Hibiscus tea. Fortified instant breakfast shakes. Sweets and desserts Blackstrap molasses. Seasonings and condiments Tahini. Fermented soy sauce. Other foods Wheat germ. The items listed above may not be a complete list of recommended foods and beverages. Contact a dietitian for more information. What  foods should I avoid? Grains Whole grains. Bran cereal. Bran flour. Oats. Meats and other proteins Soybeans. Products made from soy protein. Black beans. Lentils. Mung beans. Split peas. Dairy Milk. Cream. Cheese. Yogurt. Cottage cheese. Beverages Coffee. Black tea. Red wine. Sweets and desserts Cocoa. Chocolate. Ice cream. Other foods Basil. Oregano. Large amounts of parsley. The items listed above may not be a complete list of foods and beverages to avoid. Contact a dietitian for more information. Summary  Iron is a mineral that helps your body to produce hemoglobin. Hemoglobin is a protein in red blood cells that carries oxygen to your body's tissues.  Iron is naturally found in many foods, and many foods have iron added to them (iron-fortified foods).  When you eat foods that contain iron, you should eat them with foods that are high in vitamin C. Vitamin C helps your body to absorb iron.  Certain foods and drinks prevent your body from absorbing iron properly, such as whole grains and dairy products. You should avoid eating these foods in the same meal as iron-rich foods or with iron supplements. This information is not intended to replace advice given to you by your health care provider. Make sure you discuss any questions you have with your health care provider. Document Revised: 01/01/2017 Document Reviewed: 12/15/2016 Elsevier Patient Education  2020 [place]  FODMAPs (fermentable oligosaccharides, disaccharides, monosaccharides, and polyols) are sugars that are hard for some people to digest. A low-FODMAP eating plan may help some people who have bowel (intestinal) diseases to manage their symptoms. This meal plan can be complicated to follow. Work with a diet and nutrition specialist (dietitian) to make a low-FODMAP eating plan that is right for you. A dietitian can make sure that you get enough nutrition from this diet. What are tips for  following this plan? Reading food labels  Check labels for hidden FODMAPs such as: ? High-fructose syrup. ? Honey. ? Agave. ? Natural fruit flavors. ? Onion or garlic powder.  Choose low-FODMAP foods that contain 3-4 grams of fiber per serving.  Check food labels for serving sizes. Eat only one serving at a time to make sure FODMAP levels stay low. Meal planning  Follow a low-FODMAP eating plan for up to 6 weeks, or as told by your health care provider or dietitian.  To follow the eating plan: 1. Eliminate high-FODMAP foods from your diet completely. 2. Gradually reintroduce high-FODMAP foods into your diet one at a time. Most people should wait a few days after introducing one high-FODMAP food before they introduce the next high-FODMAP food. Your dietitian can recommend how quickly you may reintroduce foods. 3. Keep a daily record of what you eat and drink, and make note of any symptoms that you have after eating. 4. Review your daily record with a dietitian regularly. Your dietitian can help you identify which foods you can eat and which foods you should avoid. General tips  Drink enough fluid each day to keep your urine pale yellow.  Avoid processed foods. These often have added sugar and may be high in FODMAPs.  Avoid most dairy products, whole grains, and sweeteners.  Work with a Microbiologist to make sure  you get enough fiber in your diet. Recommended foods Grains  Gluten-free grains, such as rice, oats, buckwheat, quinoa, corn, polenta, and millet. Gluten-free pasta, bread, or cereal. Rice noodles. Corn tortillas. Vegetables  Eggplant, zucchini, cucumber, peppers, green beans, Brussels sprouts, bean sprouts, lettuce, arugula, kale, Swiss chard, spinach, collard greens, bok choy, summer squash, potato, and tomato. Limited amounts of corn, carrot, and sweet potato. Green parts of scallions. Fruits  Bananas, oranges, lemons, limes, blueberries, raspberries, strawberries,  grapes, cantaloupe, honeydew melon, kiwi, papaya, passion fruit, and pineapple. Limited amounts of dried cranberries, banana chips, and shredded coconut. Dairy  Lactose-free milk, yogurt, and kefir. Lactose-free cottage cheese and ice cream. Non-dairy milks, such as almond, coconut, hemp, and rice milk. Yogurts made of non-dairy milks. Limited amounts of goat cheese, brie, mozzarella, parmesan, swiss, and other hard cheeses. Meats and other protein foods  Unseasoned beef, pork, poultry, or fish. Eggs. Berniece Salines. Tofu (firm) and tempeh. Limited amounts of nuts and seeds, such as almonds, walnuts, [place] nuts, pecans, peanuts, pumpkin seeds, chia seeds, and sunflower seeds. Fats and oils  Butter-free spreads. Vegetable oils, such as olive, canola, and sunflower oil. Seasoning and other foods  Artificial sweeteners with names that do not end in "ol" such as aspartame, saccharine, and stevia. Maple syrup, white table sugar, raw sugar, brown sugar, and molasses. Fresh basil, coriander, parsley, rosemary, and thyme. Beverages  Water and mineral water. Sugar-sweetened soft drinks. Small amounts of orange juice or cranberry juice. Black and green tea. Most dry wines. Coffee. This may not be a complete list of low-FODMAP foods. Talk with your dietitian for more information. Foods to avoid Grains  Wheat, including kamut, durum, and semolina. Barley and bulgur. Couscous. Wheat-based cereals. Wheat noodles, bread, crackers, and pastries. Vegetables  Chicory root, artichoke, asparagus, cabbage, snow peas, sugar snap peas, mushrooms, and cauliflower. Onions, garlic, leeks, and the white part of scallions. Fruits  Fresh, dried, and juiced forms of apple, pear, watermelon, peach, plum, cherries, apricots, blackberries, boysenberries, figs, nectarines, and mango. Avocado. Dairy  Milk, yogurt, ice cream, and soft cheese. Cream and sour cream. Milk-based sauces. Custard. Meats and other protein foods  Fried  or fatty meat. Sausage. Cashews and pistachios. Soybeans, baked beans, black beans, chickpeas, kidney beans, fava beans, navy beans, lentils, and split peas. Seasoning and other foods  Any sugar-free gum or candy. Foods that contain artificial sweeteners such as sorbitol, mannitol, isomalt, or xylitol. Foods that contain honey, high-fructose corn syrup, or agave. Bouillon, vegetable stock, beef stock, and chicken stock. Garlic and onion powder. Condiments made with onion, such as hummus, chutney, pickles, relish, salad dressing, and salsa. Tomato paste. Beverages  Chicory-based drinks. Coffee substitutes. Chamomile tea. Fennel tea. Sweet or fortified wines such as port or sherry. Diet soft drinks made with isomalt, mannitol, maltitol, sorbitol, or xylitol. Apple, pear, and mango juice. Juices with high-fructose corn syrup. This may not be a complete list of high-FODMAP foods. Talk with your dietitian to discuss what dietary choices are best for you.  Summary  A low-FODMAP eating plan is a short-term diet that eliminates FODMAPs from your diet to help ease symptoms of certain bowel diseases.  The eating plan usually lasts up to 6 weeks. After that, high-FODMAP foods are restarted gradually, one at a time, so you can find out which may be causing symptoms.  A low-FODMAP eating plan can be complicated. It is best to work with a dietitian who has experience with this type of plan. This information is not intended  to replace advice given to you by your health care provider. Make sure you discuss any questions you have with your health care provider. Document Revised: 01/01/2017 Document Reviewed: 09/15/2016 Elsevier Patient Education  [place].

## 2019-12-18 NOTE — Progress Notes (Signed)
Primary Care Physician: Josem Kaufmann, MD  Primary Gastroenterologist:  Garfield Cornea, MD   Chief Complaint  Patient presents with  . Gas  . Anemia    stopped Iron d/t made her sick; hasn't seen any blood    HPI: Ana Chandler is a 74 y.o. female here for follow-up of anemia.  She was seen back in May for anemia, but work-up was delayed pending cardiac work-up.  At that time she had some vague abdominal discomfort, occasional bright red blood per rectum with straining.  On PPI for years for chronic GERD.  Labs from May 2021 with hemoglobin 9, hematocrit 31.6, MCV 64.  Hemoglobin had been 10.9 with MCV of 74.9 back in January 2020.  Patient considered lack of iron in her diet contributing to her anemia.  Typically not much meat either because her husband has alpha gal.  She completed colonoscopy and EGD in September.  Colonoscopy showed diverticulosis, there were two 3 to 5 mm polyps (tubular adenomas) removed from the cecum, suspected benign anorectal bleeding in the setting of constipation.  No future colonoscopy for screening and surveillance due to age.  EGD showed small hiatal hernia was present. Patient had a 1.2 cm pedunculated polyp on a relatively long, thick pedicle in the antrum. Surface was ulcerated. It had overlying clot; was actually oozing blood during the examination.Remainder the gastric mucosa appeared normal. Base of polyp was clip x2. Polyp was removed entirely with 1 pass of hot snare cautery.  Gastric polyp was benign.  Gastric biopsies negative for H. pylori.  No future MRIs until confirmed hemostasis clips past.  Labs from September: Hemoglobin 13.2, hematocrit 44.1, MCV 78.6.  Iron studies back in June consistent with IDA.  Ferritin was 8 at that time.  Celiac serologies were negative.  Clinically doing better.  Fatigue has improved.  Denies any abdominal pain.  No heartburn.  No further rectal bleeding.  No melena.  States has a lot of gas that she  cannot control.  Embarrassing. Mild bloating.  Has tried some Gas-X which does seem to help.  Symptoms throughout day and night.   Current Outpatient Medications  Medication Sig Dispense Refill  . aspirin 81 MG tablet Take 81 mg by mouth daily.    . Calcium Carbonate-Vitamin D (CALCIUM + D PO) Take 1 tablet by mouth 2 (two) times daily.     . cholecalciferol (VITAMIN D) 1000 UNITS tablet Take 1,000 Units by mouth daily.     . fexofenadine (ALLEGRA) 180 MG tablet Take 180 mg by mouth daily as needed for allergies or rhinitis.    . fluticasone (FLONASE) 50 MCG/ACT nasal spray Place 1 spray into both nostrils daily.    Marland Kitchen GLUCOSAMINE-CHONDROITIN PO Take 1,500 mg by mouth 2 (two) times daily.     . hydrochlorothiazide (HYDRODIURIL) 25 MG tablet Take 25 mg by mouth daily.     . magnesium oxide (MAG-OX) 400 MG tablet Take 400 mg by mouth daily.    . Multiple Vitamins-Minerals (MULTIVITAMIN PO) Take 1 tablet by mouth daily.    . naproxen sodium (ALEVE) 220 MG tablet Take 220 mg by mouth 2 (two) times daily as needed (for pain or headache).    . NON FORMULARY Uses CPAP for sleep apnea    . omeprazole (PRILOSEC) 20 MG capsule Take 20 mg by mouth daily.     . rosuvastatin (CRESTOR) 10 MG tablet Take 10 mg by mouth daily.     No  current facility-administered medications for this visit.    Allergies as of 12/18/2019 - Review Complete 12/18/2019  Allergen Reaction Noted  . Contrast media [iodinated diagnostic agents] Hives 10/19/2019  . Other Anaphylaxis 10/23/2019  . Lisinopril Other (See Comments) 06/18/2014  . Losartan  04/28/2019    ROS:  General: Negative for anorexia, weight loss, fever, chills, fatigue, weakness. ENT: Negative for hoarseness, difficulty swallowing , nasal congestion. CV: Negative for chest pain, angina, palpitations, dyspnea on exertion, peripheral edema.  Respiratory: Negative for dyspnea at rest, dyspnea on exertion, cough, sputum, wheezing.  GI: See history of present  illness. GU:  Negative for dysuria, hematuria, urinary incontinence, urinary frequency, nocturnal urination.  Endo: Negative for unusual weight change.    Physical Examination:   BP (!) 145/79   Pulse 66   Temp (!) 96.8 F (36 C) (Temporal)   Ht 5' (1.524 m)   Wt 163 lb 3.2 oz (74 kg)   BMI 31.87 kg/m   General: Well-nourished, well-developed in no acute distress.  Eyes: No icterus. Mouth: masked. Lungs: Clear to auscultation bilaterally.  Heart: Regular rate and rhythm, no murmurs rubs or gallops.  Abdomen: Bowel sounds are normal, nontender, nondistended, no hepatosplenomegaly or masses, no abdominal bruits or hernia , no rebound or guarding.   Extremities: No lower extremity edema. No clubbing or deformities. Neuro: Alert and oriented x 4   Skin: Warm and dry, no jaundice.   Psych: Alert and cooperative, normal mood and affect.  Labs:  See hpi   Imaging Studies: No results found.   Impression/plan:  74 year old female with chronic GERD, IDA, rectal bleeding presenting for follow-up.  Colonoscopy with couple of small tubular adenomas removed.  Rectal bleeding thought to be benign anorectal source in the setting of constipation.  No future colonoscopies needed for screening or surveillance purposes.  Labs were consistent with IDA.  On endoscopy she was found to have good-sized gastric polyp with active oozing/clot likely contributing to her IDA from chronic GI bleeding.  Her hemoglobin was normal back in September.  MCV improved but still slightly low.  Recommend she have repeat CBC and ferritin next month, she states that she has plans to see her PCP for labs.  Requested that she have results faxed to [place] for review.  She did not tolerate oral iron, recommend increasing dietary iron.  Handout provided.  Complains of excessive flatulence/some bloating.  Recommend avoiding high FODMAP foods.  Use Gas-X and/or Beano as per package label.  Add probiotic.  Call with persistent  problems.  We will see her back in 6 months for follow-up.  She is to call sooner if needed.

## 2020-01-30 ENCOUNTER — Telehealth: Payer: Self-pay | Admitting: Gastroenterology

## 2020-01-30 NOTE — Telephone Encounter (Signed)
Received labs from PCP 01/19/20 H/H 13.8/43.7, MCV 79, iron 91, TIBC 404, iron sat 23  Continue high iron diet.   Keep OV 06/2020.

## 2020-01-31 ENCOUNTER — Telehealth: Payer: Self-pay | Admitting: Internal Medicine

## 2020-01-31 NOTE — Telephone Encounter (Signed)
Patient returning call. [phone]

## 2020-01-31 NOTE — Telephone Encounter (Signed)
See other phone note

## 2020-01-31 NOTE — Telephone Encounter (Signed)
Spoke with pt. Pt was notified of that labs were reviewed, pt will continue iron rich diet and f/u at her next apt.

## 2020-01-31 NOTE — Telephone Encounter (Signed)
Left a message with pts spouse. Spouse will have pt call back.

## 2020-06-14 ENCOUNTER — Other Ambulatory Visit: Payer: Self-pay | Admitting: Family Medicine

## 2020-06-14 DIAGNOSIS — Z1231 Encounter for screening mammogram for malignant neoplasm of breast: Secondary | ICD-10-CM

## 2020-06-17 ENCOUNTER — Encounter: Payer: Self-pay | Admitting: Gastroenterology

## 2020-06-17 ENCOUNTER — Other Ambulatory Visit: Payer: Self-pay

## 2020-06-17 ENCOUNTER — Ambulatory Visit (INDEPENDENT_AMBULATORY_CARE_PROVIDER_SITE_OTHER): Payer: Medicare Other | Admitting: Gastroenterology

## 2020-06-17 VITALS — BP 140/83 | HR 62 | Temp 97.1°F | Ht 60.0 in | Wt 159.0 lb

## 2020-06-17 DIAGNOSIS — D509 Iron deficiency anemia, unspecified: Secondary | ICD-10-CM

## 2020-06-17 DIAGNOSIS — K5900 Constipation, unspecified: Secondary | ICD-10-CM

## 2020-06-17 DIAGNOSIS — R109 Unspecified abdominal pain: Secondary | ICD-10-CM | POA: Diagnosis not present

## 2020-06-17 MED ORDER — LINACLOTIDE 72 MCG PO CAPS: 72.0000 ug | ORAL_CAPSULE | Freq: Every day | ORAL | 0 refills | Status: DC

## 2020-06-17 NOTE — Progress Notes (Signed)
Primary Care Physician: Josem Kaufmann, MD  Primary Gastroenterologist:  Garfield Cornea, MD   Chief Complaint  Patient presents with  . Anemia    Not able to take Iron d/t causing constipation  . Abdominal Pain    "squeezes"  . Gas  . Constipation    Doesn't want to take Miralax.    HPI: Ana Chandler is a 75 y.o. female here for follow-up of anemia.  Last seen in November 2021.  History of chronic GERD, IDA, rectal bleeding.  Completed colonoscopy and EGD in September.  Colonoscopy showed diverticulosis,there were two 3 to 5 mm polyps (tubular adenomas) removed from the cecum, suspected benign anorectal bleeding in the setting of constipation.  No future colonoscopy for screening and surveillance due to age.  EGD showed small hiatal hernia was present. Patient had a 1.2 cm pedunculated polyp on a relatively long, thick pedicle in the antrum. Surface was ulcerated. It had overlying clot; was actually oozing blood during the examination.Remainder the gastric mucosa appeared normal. Base of polyp was clip x2. Polyp was removed entirely with 1 pass of hot snare cautery.  Gastric polyp was benign.  Gastric biopsies negative for H. pylori.  No future MRIs until confirmed hemostasis clips past.  Previous celiac serologies negative.  Iron studies/ferritin previously consistent with IDA.  Patient thought she might be lacking iron in her diet which was contributing to her anemia.  She had been avoiding red meat given her husband's diagnosis of alpha gal.  Last labs from December 2021: Hemoglobin up to 13.8, MCV 79, iron 9, TIBC 404, iron saturation is 23%.  Advised to continue high iron diet.  Today: Complains of cramping pain below umbilicus.  Not severe but very noticeable. Not worse with meals.  Occur every other day. No nocturnal symptoms. Not associated with BMs. Trying to increase iron in foods.  Having a BM 2-3 times per week.  Stools can be hard.  Complains of bloating and  gas although somewhat improved with Hardin Negus' colon health.  No nausea or vomiting.  No heartburn.  No melena or rectal bleeding.  To miralax bowel prep last year.  Does not want to ever take again.  States it made her shaky and hands swell, toes still felt swollen, she still did not the same anymore.   Current Outpatient Medications  Medication Sig Dispense Refill  . aspirin 81 MG tablet Take 81 mg by mouth daily.    . Calcium Carbonate-Vitamin D (CALCIUM + D PO) Take 1 tablet by mouth 2 (two) times daily.     . cholecalciferol (VITAMIN D) 1000 UNITS tablet Take 1,000 Units by mouth daily.     . fexofenadine (ALLEGRA) 180 MG tablet Take 180 mg by mouth daily as needed for allergies or rhinitis.    . fluticasone (FLONASE) 50 MCG/ACT nasal spray Place 1 spray into both nostrils daily.    Marland Kitchen GLUCOSAMINE-CHONDROITIN PO Take 1,500 mg by mouth 2 (two) times daily.     . hydrochlorothiazide (HYDRODIURIL) 25 MG tablet Take 25 mg by mouth daily.     . magnesium oxide (MAG-OX) 400 MG tablet Take 400 mg by mouth daily.    . Multiple Vitamins-Minerals (MULTIVITAMIN PO) Take 1 tablet by mouth daily.    . naproxen sodium (ALEVE) 220 MG tablet Take 220 mg by mouth 2 (two) times daily as needed (for pain or headache).    . NON FORMULARY Uses CPAP for sleep apnea    .  omeprazole (PRILOSEC) 20 MG capsule Take 20 mg by mouth daily.     . rosuvastatin (CRESTOR) 10 MG tablet Take 10 mg by mouth daily.     No current facility-administered medications for this visit.    Allergies as of 06/17/2020 - Review Complete 06/17/2020  Allergen Reaction Noted  . Contrast media [iodinated diagnostic agents] Hives 10/19/2019  . Other Anaphylaxis 10/23/2019  . Lisinopril Other (See Comments) 06/18/2014  . Losartan  04/28/2019    ROS:  General: Negative for anorexia, weight loss, fever, chills, fatigue, weakness. ENT: Negative for hoarseness, difficulty swallowing , nasal congestion. CV: Negative for chest pain,  angina, palpitations, dyspnea on exertion, peripheral edema.  Respiratory: Negative for dyspnea at rest, dyspnea on exertion, cough, sputum, wheezing.  GI: See history of present illness. GU:  Negative for dysuria, hematuria, urinary incontinence, urinary frequency, nocturnal urination.  Endo: Negative for unusual weight change.    Physical Examination:   BP 140/83   Pulse 62   Temp (!) 97.1 F (36.2 C) (Temporal)   Ht 5' (1.524 m)   Wt 159 lb (72.1 kg)   BMI 31.05 kg/m   General: Well-nourished, well-developed in no acute distress.  Eyes: No icterus. Mouth: masked Lungs: Clear to auscultation bilaterally.  Heart: Regular rate and rhythm, no murmurs rubs or gallops.  Abdomen: Bowel sounds are normal, nontender, nondistended, no hepatosplenomegaly or masses, no abdominal bruits or hernia , no rebound or guarding.   Extremities: No lower extremity edema. No clubbing or deformities. Neuro: Alert and oriented x 4   Skin: Warm and dry, no jaundice.   Psych: Alert and cooperative, normal mood and affect.  Labs:  See hpi   Imaging Studies: No results found.   Assessment:  Pleasant 75 year old female presenting for follow-up iron deficiency anemia, chronic GERD, constipation, flatulence/bloating.  IDA: On endoscopy she was found to have a good-sized gastric polyp which was actively oozing/clot likely contributing to IDA from chronic GI bleeding.  Hemoglobin was normal back in December.  Serum iron remains low.  She does not tolerate oral iron.  Encouraged her to increase dietary iron which she states she has tried somewhat.  No overt GI bleeding.  Update labs at this time.  Avoid NSAID's much as possible.  Chronic GERD: Doing well on current regimen.  Constipation/flatulence/bloating: Having a BM about 3 times per week, consist of hard stools.  Has been on probiotic which helps some with bloating and gas.  Does not want to try MiraLAX, feels like after MiraLAX prep last year she  developed swelling of her toes and fingers which she states are still swollen.  Willing to try Linzess 72 mcg daily.  Samples to be provided.  She will call with a progress report once completed.  Persistent abdominal cramping, may need further work-up.  We will have her return to the office in 3 to 4 months.  She will call sooner if needed.   Plan: 1. Try Linzess 72 mcg daily on empty stomach for constipation.  Samples provided today. 2. Call once you have completed samples and let me know whether or not your abdominal cramping, bloating have improved.  If not, you will need further imaging. 3. For history of iron deficiency anemia, would recommend updating labs at any time. 4. Avoid NSAIDs as much as possible. 5. Return to the office in 3 to 4 months.  Call sooner if needed.

## 2020-06-17 NOTE — Patient Instructions (Signed)
1. Try Linzess 72 mcg daily on empty stomach for constipation.  Samples provided today. 2. Call once you have completed samples and let me know whether or not your abdominal cramping, bloating have improved.  If not, you will need further imaging. 3. For history of iron deficiency anemia, would recommend updating labs at any time. 4. Return to the office in 3 to 4 months.  Call sooner if needed.

## 2020-06-19 ENCOUNTER — Other Ambulatory Visit: Payer: Self-pay | Admitting: Obstetrics and Gynecology

## 2020-06-19 ENCOUNTER — Other Ambulatory Visit: Payer: Self-pay | Admitting: Family Medicine

## 2020-06-19 DIAGNOSIS — Z13820 Encounter for screening for osteoporosis: Secondary | ICD-10-CM

## 2020-07-27 LAB — IRON,TIBC AND FERRITIN PANEL
Ferritin: 17 ng/mL (ref 15–150)
Iron Saturation: 12 % — ABNORMAL LOW (ref 15–55)
Iron: 45 ug/dL (ref 27–139)
Total Iron Binding Capacity: 370 ug/dL (ref 250–450)
UIBC: 325 ug/dL (ref 118–369)

## 2020-07-29 ENCOUNTER — Telehealth: Payer: Self-pay | Admitting: *Deleted

## 2020-07-29 NOTE — Telephone Encounter (Signed)
Noted. Thanks.

## 2020-07-29 NOTE — Telephone Encounter (Signed)
Spoke to pt.  She said that [place] it and told her that they were going to add the lab on.  Will call to verify with [facility].

## 2020-07-29 NOTE — Telephone Encounter (Signed)
Spoke with Tish [phone]) at PCP's office.  She informed me that CBC was done.  She is faxing results to [place].

## 2020-08-12 ENCOUNTER — Other Ambulatory Visit: Payer: Self-pay

## 2020-08-12 ENCOUNTER — Ambulatory Visit
Admission: RE | Admit: 2020-08-12 | Discharge: 2020-08-12 | Disposition: A | Discharge status: Home / Self Care | Payer: Medicare Other | Source: Ambulatory Visit | Attending: Family Medicine | Admitting: Family Medicine

## 2020-08-12 DIAGNOSIS — Z1231 Encounter for screening mammogram for malignant neoplasm of breast: Secondary | ICD-10-CM

## 2020-08-16 ENCOUNTER — Other Ambulatory Visit: Payer: Self-pay | Admitting: *Deleted

## 2020-08-16 DIAGNOSIS — D509 Iron deficiency anemia, unspecified: Secondary | ICD-10-CM

## 2020-08-16 NOTE — Addendum Note (Signed)
Addended by: Metro Kung on: 08/16/2020 09:39 AM   Modules accepted: Orders

## 2020-08-29 ENCOUNTER — Telehealth: Payer: Self-pay | Admitting: Internal Medicine

## 2020-08-29 NOTE — Telephone Encounter (Signed)
Noted  

## 2020-08-29 NOTE — Telephone Encounter (Signed)
Spoke to pt she informed me that she is having sever gas pains under her breast and stomach area. She states no diarrhea and no constipation. States she has been going a little at a time. States she has taking Gas-x and Pepto-Bismol and nothing is giving any relief.  Wants to know what else she can do?

## 2020-08-29 NOTE — Telephone Encounter (Signed)
Yesterday morning after breakfast, feels like blew up like a balloon. Hurts into the epigastric region and into the back. Tried gas-x and pepto with no relief. No n/v. No fever. About the same today, not any better but not worse. Feels like can't pass gas. Can't belch. Has had few small bms, did not try the Linzess after last ov. Stools are small but no longer hard like before.Nothing new as far as food or medications. Not able to pass gas either way.   Advised to continue gas-x. Can try beano. Hold on pepto. Try Linzess 29mg daily, has samples at home. If she does not get relief, has worsening pain, has vomiting or fever, she should go to the ER. She voiced understanding.   She will call Monday with a progress report.   FMadelyn Flavors

## 2020-08-29 NOTE — Telephone Encounter (Signed)
Pt called asking to speak with the nurse. Please call [phone]

## 2020-09-02 ENCOUNTER — Telehealth: Payer: Self-pay | Admitting: Gastroenterology

## 2020-09-02 NOTE — Telephone Encounter (Signed)
Spoke to pt.  Stomach soreness around belly button started last Tuesday.  Worse when bending.  Had cramping before diarrhea started today.  Had 3 to 4 loose to watery stools.  Started taking Linzess on Thursday.  Has only ate potato, toast, and had gingerale.  Eating very light.  No n/v, rectal bleeding, or fever.  Diarrhea just started today.  Feels better now.  Cramping is gone.

## 2020-09-02 NOTE — Telephone Encounter (Signed)
PATIENT CALLED WITH UPDATE, HER CRAMPS ARE GONE BUT SHE STILL HAS STOMACH SORENESS

## 2020-09-02 NOTE — Telephone Encounter (Signed)
Clinical team, please call and get more information. If she is feeling better, I would advise continue to monitor and keep upcoming ov. Let me know what you find out.

## 2020-09-03 NOTE — Telephone Encounter (Signed)
noted 

## 2020-09-03 NOTE — Telephone Encounter (Signed)
Linzess likely contributing to diarrhea. She can hold and use 2-3 times per week. Since we last saw her 3 months ago, lets try to move her appt up sooner. I would like to evaluate her before deciding if CT imaging is needed.

## 2020-09-03 NOTE — Telephone Encounter (Addendum)
Spoke with pt.  She was advised that [place] may be contributing to diarrhea.  She was advised to hold and use 2 to 3 times per week.  She was informed that we have added her to our wait list to move appt up sooner if any cancellations.  Pt informed that Neil Crouch, PA-C would like to evaluate her before deciding if CT imaging is needed.  Pt voiced understanding.    Caryl Asp and Zeb Comfort:  Can you keep an eye on schedule and see if we can move appt up sooner per Neil Crouch, PA-C?

## 2020-09-04 ENCOUNTER — Telehealth: Payer: Self-pay | Admitting: *Deleted

## 2020-09-04 ENCOUNTER — Other Ambulatory Visit: Payer: Self-pay

## 2020-09-04 ENCOUNTER — Encounter: Payer: Self-pay | Admitting: Internal Medicine

## 2020-09-04 ENCOUNTER — Ambulatory Visit (INDEPENDENT_AMBULATORY_CARE_PROVIDER_SITE_OTHER): Payer: Medicare Other | Admitting: Internal Medicine

## 2020-09-04 VITALS — BP 162/80 | HR 62 | Temp 97.5°F | Ht 60.0 in | Wt 148.2 lb

## 2020-09-04 DIAGNOSIS — R109 Unspecified abdominal pain: Secondary | ICD-10-CM | POA: Diagnosis not present

## 2020-09-04 MED ORDER — PREDNISONE 50 MG PO TABS: ORAL_TABLET | ORAL | 0 refills | Status: DC

## 2020-09-04 NOTE — Progress Notes (Signed)
Primary Care Physician:  Josem Kaufmann, MD Primary Gastroenterologist:  Dr. Gala Romney  Pre-Procedure History & Physical: HPI:  Ana Chandler is a 75 y.o. female here for follow-up.  Called in last week with acute onset periumbilical abdominal pain and no bowel movement over a couple of days.  Linzess 72 on hand at home.  She was instructed to take Linzess daily until she accomplished a BM.  She went another 3 days without having a bowel movement  -  finally did have a bowel movement and , coincidentally, abdominal pain did lessen today but still about 3 out of 10 in severity.  No rectal bleeding or melena.  No upper GI tract symptoms such as nausea or vomiting.  GERD well-controlled on omeprazole 20 mg daily.  No dysphagia. Pain which she had was quite localized to her umbilical area.  She denies perceiving a bulge.  Shetake naproxen regularly. Recent colonoscopy and EGD for iron deficiency rectal bleeding yielded a large hyperplastic gastric polyp which was removed.  No H. pylori.  Pancolonic diverticulosis and small adenomas removed-no future colonoscopy recommended.  Anemia has improved.  No further rectal bleeding.  Celiac screen previously negative.  Past Medical History:  Diagnosis Date   Arthritis    Dyspnea 2018   High cholesterol    Hypertension    Sleep apnea     Past Surgical History:  Procedure Laterality Date   [place]   BIOPSY  10/23/2019   Procedure: BIOPSY;  Surgeon: Daneil Dolin, MD;  Location: [facility];  Service: Endoscopy;;   Bone Spur Removal     BREAST BIOPSY Left 2008   CARDIAC CATHETERIZATION  2019   CHOLECYSTECTOMY  2000   COLONOSCOPY N/A 06/18/2014   Zygmund Passero: diverticulosis   COLONOSCOPY WITH PROPOFOL N/A 10/23/2019   Dr. Gala Romney: Diverticulosis of the entire colon, two 3 to 5 mm polyps removed from the cecum (pathology benign), suspected benign anorectal bleeding in the setting of constipation.  No future screening or  surveillance colonoscopies needed due to age.   ESOPHAGOGASTRODUODENOSCOPY (EGD) WITH PROPOFOL N/A 10/23/2019   Dr. Gala Romney: Small hiatal hernia, 1.2 cm pedunculated polyp on a relatively long, thick pedicle in the antrum, surface ulcerated with overlying clot and actively oozing.  In its entirety.  Base of the polyp was clipped x2.  Gastric biopsies obtained to assess for H. pylori.  No H. pylori noted.  Gastric polyp benign.   EYE SURGERY Left 2019   Toric lens L eye   HAND SURGERY Right 2015   KNEE ARTHROSCOPY Right 2017   PARTIAL HYSTERECTOMY  1977   POLYPECTOMY  10/23/2019   Procedure: POLYPECTOMY;  Surgeon: Daneil Dolin, MD;  Location: [facility];  Service: Endoscopy;;   THYROID LOBECTOMY Right 03/04/2018   THYROIDECTOMY Right 03/04/2018   Procedure: RIGHT THYROID LOBECTOMY;  Surgeon: Melida Quitter, MD;  Location: [facility];  Service: ENT;  Laterality: Right;   [place]    Prior to Admission medications   Medication Sig Start Date End Date Taking? Authorizing Provider  aspirin 81 MG tablet Take 81 mg by mouth daily.   Yes [provider]  Calcium Carbonate-Vitamin D (CALCIUM + D PO) Take 1 tablet by mouth 2 (two) times daily.    Yes [provider]  cholecalciferol (VITAMIN D) 1000 UNITS tablet Take 1,000 Units by mouth daily.    Yes [provider]  fexofenadine (ALLEGRA) 180 MG tablet Take 180  mg by mouth daily as needed for allergies or rhinitis.   Yes [provider]  fluticasone (FLONASE) 50 MCG/ACT nasal spray Place 1 spray into both nostrils daily.   Yes [provider]  GLUCOSAMINE-CHONDROITIN PO Take 1,500 mg by mouth 2 (two) times daily.    Yes [provider]  hydrochlorothiazide (HYDRODIURIL) 25 MG tablet Take 25 mg by mouth daily.  06/05/19  Yes [provider]  magnesium oxide (MAG-OX) 400 MG tablet Take 400 mg by mouth daily.   Yes [provider]  Multiple Vitamins-Minerals (MULTIVITAMIN  PO) Take 1 tablet by mouth daily.   Yes [provider]  naproxen sodium (ALEVE) 220 MG tablet Take 220 mg by mouth 2 (two) times daily as needed (for pain or headache).   Yes [provider]  NON FORMULARY Uses CPAP for sleep apnea   Yes [provider]  omeprazole (PRILOSEC) 20 MG capsule Take 20 mg by mouth daily.    Yes [provider]  rosuvastatin (CRESTOR) 10 MG tablet Take 10 mg by mouth daily. 09/07/19  Yes [provider]  linaclotide (LINZESS) 72 MCG capsule Take 1 capsule (72 mcg total) by mouth daily before breakfast. Patient not taking: Reported on 09/04/2020 06/17/20   Mahala Menghini, PA-C    Allergies as of 09/04/2020 - Review Complete 09/04/2020  Allergen Reaction Noted   Contrast media [iodinated diagnostic agents] Hives 10/19/2019   Other Anaphylaxis 10/23/2019   Lisinopril Other (See Comments) 06/18/2014   Losartan  04/28/2019    Family History  Problem Relation Age of Onset   Hypercholesterolemia Mother    COPD Mother    Lung cancer Father    Colon cancer Neg Hx    Colon polyps Neg Hx    Celiac disease Neg Hx    Inflammatory bowel disease Neg Hx     Social History   Socioeconomic History   Marital status: Married    Spouse name: Not on file   Number of children: Not on file   Years of education: Not on file   Highest education level: Not on file  Occupational History   Not on file  Tobacco Use   Smoking status: Never   Smokeless tobacco: Never  Vaping Use   Vaping Use: Never used  Substance and Sexual Activity   Alcohol use: Yes    Alcohol/week: 0.0 standard drinks    Comment: occ   Drug use: No   Sexual activity: Not on file  Other Topics Concern   Not on file  Social History Narrative   Not on file   Social Determinants of Health   Financial Resource Strain: Not on file  Food Insecurity: Not on file  Transportation Needs: Not on file  Physical Activity: Not on file  Stress: Not on file  Social  Connections: Not on file  Intimate Partner Violence: Not on file    Review of Systems: See HPI, otherwise negative ROS  Physical Exam: BP (!) 162/80   Pulse 62   Temp (!) 97.5 F (36.4 C)   Ht 5' (1.524 m)   Wt 148 lb 3.2 oz (67.2 kg)   BMI 28.94 kg/m  General:   Alert,  Well-developed, well-nourished, pleasant and cooperative in NAD Neck:  Supple; no masses or thyromegaly. No significant cervical adenopathy. Lungs:  Clear throughout to auscultation.   No wheezes, crackles, or rhonchi. No acute distress. Heart:  Regular rate and rhythm; no murmurs, clicks, rubs,  or gallops. Abdomen: Nondistended.  Positive bowel sounds.  She has localized periumbilical tenderness to palpation in the umbilicus.  I did not appreciate a fascial defect but significantly tender in this area.  No obvious mass.  Pulses:  Normal pulses noted. Extremities:  Without clubbing or edema.  Impression/Plan: 75 year old lady with acute onset periumbilical pain with perceived exacerbation and constipation last week.  Focal pain in the periumbilical area with temporal relationship to constipation; some improvement in pain, but not total resolution, with bowel movement.   She has localized abdominal wall tenderness in the area of the umbilicus without appreciable mass or fascial defect.  I am concerned that she may have an occult hernia or other process which could be contributing to her recent abdominal pain rather than simply constipation.  Recommendations:  Given this clinical scenario, I recommend we proceed with a CT scan of the abdomen and pelvis with IV and oral contrast to further evaluate abdominal pain  This will be scheduled and timed with the radiology department.  Given her contrast allergy, she will take prednisone 50 mg orally 13 hours before the scan, 7 hours before the scan and 1 hour before the scan.  In addition, Benadryl 50 mg orally 1 hour prior to the scan.  Use Linzess 72 daily on an as-needed  basis for constipation for now.  Continue taking omeprazole 20 mg daily  Further recommendations to follow pending review of pathology report        Notice: This dictation was prepared with Dragon dictation along with smaller phrase technology. Any transcriptional errors that result from this process are unintentional and may not be corrected upon review.

## 2020-09-04 NOTE — Telephone Encounter (Signed)
Called pt, LMOVM. Letter mailed with CT appt information

## 2020-09-04 NOTE — Patient Instructions (Signed)
As discussed, we will proceed with a CT scan of the abdomen and pelvis with IV and oral contrast to further evaluate your abdominal pain  This will be scheduled and timed with the radiology department.  You will take prednisone 50 mg orally 13 hours before the scan, 7 hours before the scan and 1 hour before the scan.  In addition, you will also take Benadryl 50 mg orally 1 hour prior to the scan.  Use Linzess 72 daily on an as-needed basis for constipation for now.  Continue taking omeprazole 20 mg daily  Further recommendations to follow pending review of pathology report

## 2020-09-06 NOTE — Telephone Encounter (Signed)
Pt called in and said that her CT is scheduled for 09/24/2020 at [facility].  She thinks this is too long to wait.  Requested that I send Dr. Gala Romney a message to see if she truly should wait.  She said that she was under the impression he wanted it this week or next.  Pt willing to go to [facility] if Dr. Gala Romney thinks it needs to be sooner.

## 2020-09-06 NOTE — Telephone Encounter (Signed)
Called pt. Aware appt moved up to 8/10 at 12:30pm, arrival 12:15pm, npo 4 hrs prior, p/u oral prep. She already has her pred and benadyl to pre medicate

## 2020-09-06 NOTE — Telephone Encounter (Signed)
Noted.  Called pt and informed her.  She informed me that she would be willing to go to [facility] or [facility].  Only day she can't do is 8/18 or 8/19.

## 2020-09-11 ENCOUNTER — Encounter (HOSPITAL_COMMUNITY): Payer: Self-pay | Admitting: Radiology

## 2020-09-11 ENCOUNTER — Other Ambulatory Visit: Payer: Self-pay

## 2020-09-11 ENCOUNTER — Ambulatory Visit (HOSPITAL_COMMUNITY)
Admission: RE | Admit: 2020-09-11 | Discharge: 2020-09-11 | Disposition: A | Discharge status: Home / Self Care | Payer: Medicare Other | Source: Ambulatory Visit | Attending: Internal Medicine | Admitting: Internal Medicine

## 2020-09-11 DIAGNOSIS — R109 Unspecified abdominal pain: Secondary | ICD-10-CM | POA: Diagnosis present

## 2020-09-11 LAB — POCT I-STAT CREATININE: Creatinine, Ser: 0.9 mg/dL (ref 0.44–1.00)

## 2020-09-11 MED ORDER — IOHEXOL 350 MG/ML SOLN
80.0000 mL | Freq: Once | INTRAVENOUS | Status: AC | PRN
  Administered 2020-09-11: 80 mL via INTRAVENOUS

## 2020-09-16 ENCOUNTER — Telehealth: Payer: Self-pay | Admitting: Internal Medicine

## 2020-09-16 ENCOUNTER — Other Ambulatory Visit: Payer: Self-pay | Admitting: Internal Medicine

## 2020-09-16 MED ORDER — LINACLOTIDE 145 MCG PO CAPS: 145.0000 ug | ORAL_CAPSULE | Freq: Every day | ORAL | 11 refills | Status: DC

## 2020-09-16 NOTE — Telephone Encounter (Signed)
Pt called back stating that she took one of the linzess 72 that she had on hand and has been in the bathroom several times today with diarrhea. Pt doesn't think she will be able to take the increased dose that you recommended.

## 2020-09-16 NOTE — Telephone Encounter (Signed)
Patient returning call. [phone]

## 2020-09-16 NOTE — Telephone Encounter (Signed)
Pt called stating that she can not afford the Linzess with her insurance. Pt states with insurance it will be 600 dollars. Pt looked on goodrx and it will be over 400 with them. Pt is wanting to know if there is an alternative that can be called in.

## 2020-09-17 ENCOUNTER — Encounter: Payer: Self-pay | Admitting: Internal Medicine

## 2020-09-17 NOTE — Telephone Encounter (Signed)
Called and spoke with pt. Informed her to start taking Miralax 17 g once to twice daily to get bowels moving once to twice daily. Pt verbalized understanding. Forwarded message to front for them to contact pt for an appt with app in next several weeks.

## 2020-09-24 ENCOUNTER — Ambulatory Visit (HOSPITAL_COMMUNITY): Payer: Medicare Other

## 2020-10-18 ENCOUNTER — Ambulatory Visit: Payer: Medicare Other | Admitting: Gastroenterology

## 2020-10-22 ENCOUNTER — Ambulatory Visit: Payer: Medicare Other | Admitting: Gastroenterology

## 2020-11-04 ENCOUNTER — Other Ambulatory Visit: Payer: Self-pay | Admitting: *Deleted

## 2020-11-04 ENCOUNTER — Encounter: Payer: Self-pay | Admitting: *Deleted

## 2020-11-04 DIAGNOSIS — D509 Iron deficiency anemia, unspecified: Secondary | ICD-10-CM

## 2020-12-11 ENCOUNTER — Encounter: Payer: Self-pay | Admitting: Internal Medicine

## 2020-12-20 ENCOUNTER — Other Ambulatory Visit: Payer: Self-pay

## 2020-12-20 ENCOUNTER — Ambulatory Visit
Admission: RE | Admit: 2020-12-20 | Discharge: 2020-12-20 | Disposition: A | Discharge status: Home / Self Care | Payer: Medicare Other | Source: Ambulatory Visit | Attending: Obstetrics and Gynecology | Admitting: Obstetrics and Gynecology

## 2020-12-20 DIAGNOSIS — Z13820 Encounter for screening for osteoporosis: Secondary | ICD-10-CM

## 2021-01-29 ENCOUNTER — Ambulatory Visit: Payer: Medicare Other | Admitting: Gastroenterology

## 2021-02-04 ENCOUNTER — Ambulatory Visit: Payer: Medicare Other | Admitting: Gastroenterology

## 2021-04-04 ENCOUNTER — Other Ambulatory Visit: Payer: Self-pay | Admitting: Gastroenterology

## 2021-04-08 LAB — IRON,TIBC AND FERRITIN PANEL
Ferritin: 9 ng/mL — ABNORMAL LOW (ref 15–150)
Iron Saturation: 9 % — CL (ref 15–55)
Iron: 36 ug/dL (ref 27–139)
Total Iron Binding Capacity: 386 ug/dL (ref 250–450)
UIBC: 350 ug/dL (ref 118–369)

## 2021-04-18 NOTE — Progress Notes (Signed)
OV made, appt card attached to ifobt bag up front, cc'ed to pcp ?

## 2021-04-25 ENCOUNTER — Ambulatory Visit (INDEPENDENT_AMBULATORY_CARE_PROVIDER_SITE_OTHER): Payer: Medicare Other | Admitting: Gastroenterology

## 2021-04-25 DIAGNOSIS — D509 Iron deficiency anemia, unspecified: Secondary | ICD-10-CM

## 2021-04-25 LAB — IFOBT (OCCULT BLOOD): IFOBT: NEGATIVE

## 2021-05-03 NOTE — Progress Notes (Signed)
ifobt ?

## 2021-05-15 NOTE — Progress Notes (Signed)
Pt aware of OV with [facility] on 5/2 and the OV for July was cancelled ?

## 2021-06-03 ENCOUNTER — Ambulatory Visit: Payer: Medicare Other | Admitting: Internal Medicine

## 2021-06-10 ENCOUNTER — Encounter: Payer: Self-pay | Admitting: Internal Medicine

## 2021-06-10 ENCOUNTER — Ambulatory Visit (INDEPENDENT_AMBULATORY_CARE_PROVIDER_SITE_OTHER): Payer: Medicare Other | Admitting: Internal Medicine

## 2021-06-10 ENCOUNTER — Other Ambulatory Visit: Payer: Self-pay

## 2021-06-10 VITALS — BP 130/70 | HR 57 | Temp 97.3°F | Ht 60.0 in | Wt 148.0 lb

## 2021-06-10 DIAGNOSIS — K219 Gastro-esophageal reflux disease without esophagitis: Secondary | ICD-10-CM | POA: Diagnosis not present

## 2021-06-10 DIAGNOSIS — D500 Iron deficiency anemia secondary to blood loss (chronic): Secondary | ICD-10-CM

## 2021-06-10 DIAGNOSIS — K5900 Constipation, unspecified: Secondary | ICD-10-CM

## 2021-06-10 NOTE — Progress Notes (Signed)
? ? ?Primary Care Physician:  Dhivianathan, Candida Peeling, MD ?Primary Gastroenterologist:  Dr.  Marland Kitchen ?Pre-Procedure History & Physical: ?HPI:  Ana Chandler is a 76 y.o. female here for follow-up of constipation and GERD.  History of iron deficiency anemia.  She has bona fide iron deficiency anemia she is on iron supplementation.  She is I FOBT negative; stools are chronically dark.  No hematochezia.  Large gastric polyp removed from her stomach - benign.  No H. pylori.  Colonoscopy up-to-date small cecal polyps and diverticulosis.  Small bowel not imaged as of yet.  Has not seen a hematologist. ?Celiac markers negative previously. ? ?GERD well-controlled on omeprazole.  MiraLAX 1 capful daily cause diarrhea.  If she does not take MiraLAX she gets relatively constipated.  She has not tried a lesser dose. ? ?Seeing a cardiologist at [facility] for heart failure with preserved ejection fraction. ? ? ?Past Medical History:  ?Diagnosis Date  ? Arthritis   ? Dyspnea 2018  ? High cholesterol   ? Hypertension   ? Sleep apnea   ? ? ?Past Surgical History:  ?Procedure Laterality Date  ? ABDOMINAL HERNIA REPAIR  1995  ? BIOPSY  10/23/2019  ? Procedure: BIOPSY;  Surgeon: Daneil Dolin, MD;  [facility];  Service: Endoscopy;;  ? Bone Spur Removal    ? BREAST BIOPSY Left 2008  ? CARDIAC CATHETERIZATION  2019  ? CHOLECYSTECTOMY  2000  ? COLONOSCOPY N/A 06/18/2014  ? [place]: diverticulosis  ? COLONOSCOPY WITH PROPOFOL N/A 10/23/2019  ? Dr. Gala Romney: Diverticulosis of the entire colon, two 3 to 5 mm polyps removed from the cecum (pathology benign), suspected benign anorectal bleeding in the setting of constipation.  No future screening or surveillance colonoscopies needed due to age.  ? ESOPHAGOGASTRODUODENOSCOPY (EGD) WITH PROPOFOL N/A 10/23/2019  ? Dr. Gala Romney: Small hiatal hernia, 1.2 cm pedunculated polyp on a relatively long, thick pedicle in the antrum, surface ulcerated with overlying clot and actively oozing.  In its  entirety.  Base of the polyp was clipped x2.  Gastric biopsies obtained to assess for H. pylori.  No H. pylori noted.  Gastric polyp benign.  ? EYE SURGERY Left 2019  ? Toric lens L eye  ? HAND SURGERY Right 2015  ? KNEE ARTHROSCOPY Right 2017  ? PARTIAL HYSTERECTOMY  1977  ? POLYPECTOMY  10/23/2019  ? Procedure: POLYPECTOMY;  Surgeon: Daneil Dolin, MD;  [facility];  Service: Endoscopy;;  ? THYROID LOBECTOMY Right 03/04/2018  ? THYROIDECTOMY Right 03/04/2018  ? Procedure: RIGHT THYROID LOBECTOMY;  Surgeon: Melida Quitter, MD;  Location: [facility];  Service: ENT;  Laterality: Right;  ? [place]  ? ? ?Prior to Admission medications   ?Medication Sig Start Date End Date Taking? Authorizing Provider  ?aspirin 81 MG tablet Take 81 mg by mouth daily.   Yes [provider]  ?Calcium Carbonate-Vitamin D (CALCIUM + D PO) Take 1 tablet by mouth 2 (two) times daily.    Yes [provider]  ?cholecalciferol (VITAMIN D) 1000 UNITS tablet Take 1,000 Units by mouth daily.    Yes [provider]  ?fluticasone (FLONASE) 50 MCG/ACT nasal spray Place 1 spray into both nostrils daily.   Yes [provider]  ?GLUCOSAMINE-CHONDROITIN PO Take 1,500 mg by mouth 2 (two) times daily.    Yes [provider]  ?hydrochlorothiazide (HYDRODIURIL) 25 MG tablet Take 25 mg by mouth daily.  06/05/19  Yes [provider]  ?  magnesium oxide (MAG-OX) 400 MG tablet Take 400 mg by mouth daily.   Yes [provider]  ?Multiple Vitamins-Minerals (MULTIVITAMIN PO) Take 1 tablet by mouth daily.   Yes [provider]  ?naproxen sodium (ALEVE) 220 MG tablet Take 220 mg by mouth 2 (two) times daily as needed (for pain or headache).   Yes [provider]  ?NON FORMULARY Uses CPAP for sleep apnea   Yes [provider]  ?omeprazole (PRILOSEC) 20 MG capsule Take 20 mg by mouth daily.    Yes [provider]  ?rosuvastatin (CRESTOR) 10 MG tablet Take  10 mg by mouth daily. 09/07/19  Yes [provider]  ? ? ?Allergies as of 06/10/2021 - Review Complete 06/10/2021  ?Allergen Reaction Noted  ? Contrast media [iodinated contrast media] Hives 10/19/2019  ? Other Anaphylaxis 10/23/2019  ? Lisinopril Other (See Comments) 06/18/2014  ? Losartan  04/28/2019  ? ? ?Family History  ?Problem Relation Age of Onset  ? Hypercholesterolemia Mother   ? COPD Mother   ? Lung cancer Father   ? Colon cancer Neg Hx   ? Colon polyps Neg Hx   ? Celiac disease Neg Hx   ? Inflammatory bowel disease Neg Hx   ? ? ?Social History  ? ?Socioeconomic History  ? Marital status: Married  ?  Spouse name: Not on file  ? Number of children: Not on file  ? Years of education: Not on file  ? Highest education level: Not on file  ?Occupational History  ? Not on file  ?Tobacco Use  ? Smoking status: Never  ? Smokeless tobacco: Never  ?Vaping Use  ? Vaping Use: Never used  ?Substance and Sexual Activity  ? Alcohol use: Yes  ?  Alcohol/week: 0.0 standard drinks  ?  Comment: occ  ? Drug use: No  ? Sexual activity: Not Currently  ?Other Topics Concern  ? Not on file  ?Social History Narrative  ? Not on file  ? ?Social Determinants of Health  ? ?Financial Resource Strain: Not on file  ?Food Insecurity: Not on file  ?Transportation Needs: Not on file  ?Physical Activity: Not on file  ?Stress: Not on file  ?Social Connections: Not on file  ?Intimate Partner Violence: Not on file  ? ? ?Review of Systems: ?See HPI, otherwise negative ROS ? ?Physical Exam: ?BP 130/70 (BP Location: Right Arm, Patient Position: Sitting, Cuff Size: Normal)   Pulse (!) 57   Temp (!) 97.3 ?F (36.3 ?C) (Temporal)   Ht 5' (1.524 m)   Wt 148 lb (67.1 kg)   SpO2 98%   BMI 28.90 kg/m?  ?General:   Alert,   pleasant and cooperative in NAD ?SNeck:  Supple; no masses or thyromegaly. No significant cervical adenopathy. ?Lungs:  Clear throughout to auscultation.   No wheezes, crackles, or rhonchi. No acute distress. ?Heart:   Regular rate and rhythm; no murmurs, clicks, rubs,  or gallops. ?Abdomen: Non-distended, normal bowel sounds.  Soft and nontender without appreciable mass or hepatosplenomegaly.  ?Pulses:  Normal pulses noted. ?Extremities:  Without clubbing or edema. ? ?Impression/Plan: 76 year old lady with chronic iron deficiency.  iFOBT negative.  No overt GI bleeding.  Large gastric polyp removed 2 years ago.  Colonoscopy up-to-date. ?She probably ought to see a hematologist once.  She may benefit from parenteral iron. ?At this point, I suspect a capsule study of her small bowel would be of low yield. ? ?GERD well controlled.  Constipation suboptimally controlled.  I think  we can get more mileage out of MiraLAX if we decrease the dose. ? ? ?Recommendations: ? ? ?Continue omeprazole 20 mg daily for GERD ? ?Try taking one half capful of MiraLAX at bedtime to see if this will be just right to facilitate bowel function ? ?As discussed, we will refer you to Dr. Delton Coombes on the fourth floor of [facility] to evaluate your iron poor blood ? ?At this time, I do not recommend further GI evaluation for iron deficiency anemia.  It is possible you may benefit from having a capsule study for small bowel but will hold off on that for the time being as you do not have any blood in your stool. ? ?Office visit with [place] in 3 months ? ? ? ? ?Notice: This dictation was prepared with Dragon dictation along with smaller phrase technology. Any transcriptional errors that result from this process are unintentional and may not be corrected upon review.  ?

## 2021-06-10 NOTE — Patient Instructions (Signed)
It was good seeing you again today! ? ?Continue omeprazole 20 mg daily for GERD ? ?Try taking one half capful of MiraLAX at bedtime to see if this will be just right to facilitate bowel function ? ?As discussed, we will refer you to Dr. Delton Coombes on the fourth floor of [facility] to evaluate your iron poor blood ? ?At this time, I do not recommend further GI evaluation for iron deficiency anemia.  It is possible you may benefit from having a capsule study for small bowel but will hold off on that for the time being as you do not have any blood in your stool. ? ?Office visit with [place] in 3 months ?

## 2021-06-10 NOTE — Addendum Note (Signed)
Addended by: Hassan Rowan on: 06/10/2021 04:53 PM ? ? Modules accepted: Orders ? ?

## 2021-07-11 ENCOUNTER — Inpatient Hospital Stay (HOSPITAL_COMMUNITY): Payer: Medicare Other

## 2021-07-11 ENCOUNTER — Encounter (HOSPITAL_COMMUNITY): Payer: Self-pay | Admitting: Hematology

## 2021-07-11 ENCOUNTER — Inpatient Hospital Stay (HOSPITAL_COMMUNITY): Payer: Medicare Other | Attending: Hematology | Admitting: Hematology

## 2021-07-11 VITALS — BP 158/81 | HR 56 | Temp 97.5°F | Resp 16 | Ht 60.0 in | Wt 147.7 lb

## 2021-07-11 DIAGNOSIS — Z808 Family history of malignant neoplasm of other organs or systems: Secondary | ICD-10-CM

## 2021-07-11 DIAGNOSIS — Z803 Family history of malignant neoplasm of breast: Secondary | ICD-10-CM | POA: Diagnosis not present

## 2021-07-11 DIAGNOSIS — Z801 Family history of malignant neoplasm of trachea, bronchus and lung: Secondary | ICD-10-CM | POA: Diagnosis not present

## 2021-07-11 DIAGNOSIS — Z79899 Other long term (current) drug therapy: Secondary | ICD-10-CM | POA: Insufficient documentation

## 2021-07-11 DIAGNOSIS — D509 Iron deficiency anemia, unspecified: Secondary | ICD-10-CM | POA: Diagnosis present

## 2021-07-11 LAB — CBC WITH DIFFERENTIAL/PLATELET
Abs Immature Granulocytes: 0.03 10*3/uL (ref 0.00–0.07)
Basophils Absolute: 0.1 10*3/uL (ref 0.0–0.1)
Basophils Relative: 1 %
Eosinophils Absolute: 0.2 10*3/uL (ref 0.0–0.5)
Eosinophils Relative: 3 %
HCT: 46.3 % — ABNORMAL HIGH (ref 36.0–46.0)
Hemoglobin: 14.5 g/dL (ref 12.0–15.0)
Immature Granulocytes: 0 %
Lymphocytes Relative: 28 %
Lymphs Abs: 2 10*3/uL (ref 0.7–4.0)
MCH: 24.7 pg — ABNORMAL LOW (ref 26.0–34.0)
MCHC: 31.3 g/dL (ref 30.0–36.0)
MCV: 78.9 fL — ABNORMAL LOW (ref 80.0–100.0)
Monocytes Absolute: 0.6 10*3/uL (ref 0.1–1.0)
Monocytes Relative: 8 %
Neutro Abs: 4.4 10*3/uL (ref 1.7–7.7)
Neutrophils Relative %: 60 %
Platelets: 275 10*3/uL (ref 150–400)
RBC: 5.87 MIL/uL — ABNORMAL HIGH (ref 3.87–5.11)
RDW: 21.3 % — ABNORMAL HIGH (ref 11.5–15.5)
WBC: 7.2 10*3/uL (ref 4.0–10.5)
nRBC: 0 % (ref 0.0–0.2)

## 2021-07-11 LAB — FERRITIN: Ferritin: 14 ng/mL (ref 11–307)

## 2021-07-11 LAB — RETICULOCYTES
Immature Retic Fract: 10.7 % (ref 2.3–15.9)
RBC.: 6.02 MIL/uL — ABNORMAL HIGH (ref 3.87–5.11)
Retic Count, Absolute: 84.3 10*3/uL (ref 19.0–186.0)
Retic Ct Pct: 1.4 % (ref 0.4–3.1)

## 2021-07-11 LAB — LACTATE DEHYDROGENASE: LDH: 126 U/L (ref 98–192)

## 2021-07-11 LAB — FOLATE: Folate: 38.3 ng/mL (ref >5.9)

## 2021-07-11 LAB — IRON AND TIBC
Iron: 46 ug/dL (ref 28–170)
Saturation Ratios: 12 % (ref 10.4–31.8)
TIBC: 380 ug/dL (ref 250–450)
UIBC: 334 ug/dL

## 2021-07-11 LAB — VITAMIN B12: Vitamin B-12: 322 pg/mL (ref 180–914)

## 2021-07-11 NOTE — Patient Instructions (Addendum)
[place] at [facility] Discharge Instructions  You were seen and examined today by Dr. Delton Coombes. Dr. Delton Coombes is a hematologist, meaning that he specializes in blood abnormalities. Dr. Delton Coombes discussed your past medical history, family history of cancers/blood conditions and the events that led to you being here today.  You were referred to Dr. Delton Coombes due to anemia. Dr. Delton Coombes has recommended additional lab work today in an attempt to identify the cause of your anemia.  Based on your March labs, Dr. Delton Coombes has recommended iron infusions.  Follow-up as scheduled.   Thank you for choosing [place] at [facility] to provide your oncology and hematology care.  To afford each patient quality time with our provider, please arrive at least 15 minutes before your scheduled appointment time.   If you have a lab appointment with the [facility] please come in thru the Main Entrance and check in at the main information desk.  You need to re-schedule your appointment should you arrive 10 or more minutes late.  We strive to give you quality time with our providers, and arriving late affects you and other patients whose appointments are after yours.  Also, if you no show three or more times for appointments you may be dismissed from the clinic at the providers discretion.     Again, thank you for choosing [facility].  Our hope is that these requests will decrease the amount of time that you wait before being seen by our physicians.       _____________________________________________________________  Should you have questions after your visit to [facility], please contact our office at [phone] and follow the prompts.  Our office hours are 8:00 a.m. and 4:30 p.m. Monday - Friday.  Please note that voicemails left after 4:00 p.m. may not be returned until the following business day.  We are closed weekends  and major holidays.  You do have access to a nurse 24-7, just call the main number to the clinic [phone] and do not press any options, hold on the line and a nurse will answer the phone.    For prescription refill requests, have your pharmacy contact our office and allow 72 hours.    Due to Covid, you will need to wear a mask upon entering the hospital. If you do not have a mask, a mask will be given to you at the Main Entrance upon arrival. For doctor visits, patients may have 1 support person age 9 or older with them. For treatment visits, patients can not have anyone with them due to social distancing guidelines and our immunocompromised population.

## 2021-07-11 NOTE — Progress Notes (Signed)
[place] [address] [place] [address]   CLINIC:  Medical Oncology/Hematology  Patient Care Team: Dhivianathan, Candida Peeling, MD as PCP - General (Family Medicine) Gala Romney Cristopher Estimable, MD as Consulting Physician (Gastroenterology) Derek Jack, MD as Medical Oncologist (Hematology)  CHIEF COMPLAINTS/PURPOSE OF CONSULTATION:  Evaluation for iron deficiency anemia   HISTORY OF PRESENTING ILLNESS:  Ana Chandler 76 y.o. female is here because of IDA, at the request of [place].  Today she reports feeling well. She reports has a history of anemia and has intermittent taken iron supplements. She resumed taking iron tablets in April. She denies history of blood transfusions or iron infusions. She reports sever fatigue for the past 4 years which has limited her typical daily activities and caused SOB. She denies ice pica. She reports she is intentionally trying to lose weight, and she denies unintentional weight loss.   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 She denies smoking history. She denies family history of anemia. Her father had lung cancer with smoking history, one aunts had breast cancer, and another maternal aunt had uterine cancer.   MEDICAL HISTORY:  Past Medical History:  Diagnosis Date   Arthritis    Dyspnea 2018   High cholesterol    Hypertension    Sleep apnea     SURGICAL HISTORY: Past Surgical History:  Procedure Laterality Date   [place]   BIOPSY  10/23/2019   Procedure: BIOPSY;  Surgeon: Daneil Dolin, MD;  [facility];  Service: Endoscopy;;   Bone Spur Removal     BREAST BIOPSY Left 2008   CARDIAC CATHETERIZATION  2019   CHOLECYSTECTOMY  2000   COLONOSCOPY N/A 06/18/2014   [place]: diverticulosis   COLONOSCOPY WITH PROPOFOL N/A 10/23/2019   Dr. Gala Romney: Diverticulosis of the entire colon, two 3 to 5 mm polyps removed from the cecum (pathology benign), suspected benign anorectal bleeding in the  setting of constipation.  No future screening or surveillance colonoscopies needed due to age.   ESOPHAGOGASTRODUODENOSCOPY (EGD) WITH PROPOFOL N/A 10/23/2019   Dr. Gala Romney: Small hiatal hernia, 1.2 cm pedunculated polyp on a relatively long, thick pedicle in the antrum, surface ulcerated with overlying clot and actively oozing.  In its entirety.  Base of the polyp was clipped x2.  Gastric biopsies obtained to assess for H. pylori.  No H. pylori noted.  Gastric polyp benign.   EYE SURGERY Left 2019   Toric lens L eye   HAND SURGERY Right 2015   KNEE ARTHROSCOPY Right 2017   PARTIAL HYSTERECTOMY  1977   POLYPECTOMY  10/23/2019   Procedure: POLYPECTOMY;  Surgeon: Daneil Dolin, MD;  [facility];  Service: Endoscopy;;   THYROID LOBECTOMY Right 03/04/2018   THYROIDECTOMY Right 03/04/2018   Procedure: RIGHT THYROID LOBECTOMY;  Surgeon: Melida Quitter, MD;  Location: [facility];  Service: ENT;  Laterality: Right;   TUBAL LIGATION  1975    SOCIAL HISTORY: Social History   Socioeconomic History   Marital status: Married    Spouse name: Not on file   Number of children: Not on file   Years of education: Not on file   Highest education level: Not on file  Occupational History   Not on file  Tobacco Use   Smoking status: Never   Smokeless tobacco: Never  Vaping Use   Vaping Use: Never used  Substance and Sexual Activity   Alcohol use: Yes    Alcohol/week: 0.0  standard drinks of alcohol    Comment: occ   Drug use: No   Sexual activity: Not Currently  Other Topics Concern   Not on file  Social History Narrative   Not on file   Social Determinants of Health   Financial Resource Strain: Not on file  Food Insecurity: Not on file  Transportation Needs: Not on file  Physical Activity: Not on file  Stress: Not on file  Social Connections: Not on file  Intimate Partner Violence: Not on file    FAMILY HISTORY: Family History  Problem Relation Age of Onset    Hypercholesterolemia Mother    COPD Mother    Lung cancer Father    Colon cancer Neg Hx    Colon polyps Neg Hx    Celiac disease Neg Hx    Inflammatory bowel disease Neg Hx     ALLERGIES:  is allergic to contrast media [iodinated contrast media], covid-19 (mrna) vaccine, other, lisinopril, torsemide, and losartan.  MEDICATIONS:  Current Outpatient Medications  Medication Sig Dispense Refill   aspirin 81 MG tablet Take 81 mg by mouth daily.     Calcium Carbonate-Vitamin D (CALCIUM + D PO) Take 1 tablet by mouth 2 (two) times daily.      cholecalciferol (VITAMIN D) 1000 UNITS tablet Take 1,000 Units by mouth daily.      ferrous sulfate 325 (65 FE) MG tablet Take 325 mg by mouth once.     fluticasone (FLONASE) 50 MCG/ACT nasal spray Place 1 spray into both nostrils daily.     GLUCOSAMINE-CHONDROITIN PO Take 1,500 mg by mouth 2 (two) times daily.      hydrochlorothiazide (HYDRODIURIL) 25 MG tablet Take 25 mg by mouth daily.      JARDIANCE 10 MG TABS tablet Take 10 mg by mouth daily.     magnesium oxide (MAG-OX) 400 MG tablet Take 400 mg by mouth daily.     Multiple Vitamins-Minerals (MULTIVITAMIN PO) Take 1 tablet by mouth daily.     naproxen sodium (ALEVE) 220 MG tablet Take 220 mg by mouth 2 (two) times daily as needed (for pain or headache).     NON FORMULARY Uses CPAP for sleep apnea     omeprazole (PRILOSEC) 20 MG capsule Take 20 mg by mouth daily.      rosuvastatin (CRESTOR) 10 MG tablet Take 10 mg by mouth daily.     No current facility-administered medications for this visit.    REVIEW OF SYSTEMS:   Review of Systems  Constitutional:  Positive for fatigue. Negative for appetite change.  HENT:   Positive for trouble swallowing (pills).   Respiratory:  Positive for chest tightness, cough and shortness of breath.   Gastrointestinal:  Positive for constipation.  Neurological:  Positive for dizziness.  All other systems reviewed and are negative.    PHYSICAL  EXAMINATION: ECOG PERFORMANCE STATUS: 1 - Symptomatic but completely ambulatory  Vitals:   07/11/21 0957  BP: (!) 158/81  Pulse: (!) 56  Resp: 16  Temp: (!) 97.5 F (36.4 C)  SpO2: 97%   Filed Weights   07/11/21 0957  Weight: 147 lb 11.2 oz (67 kg)   Physical Exam Vitals reviewed.  Constitutional:      Appearance: Normal appearance.  Cardiovascular:     Rate and Rhythm: Normal rate and regular rhythm.     Pulses: Normal pulses.     Heart sounds: Normal heart sounds.  Pulmonary:     Effort: Pulmonary effort is normal.  Breath sounds: Normal breath sounds.  Abdominal:     Palpations: Abdomen is soft. There is no hepatomegaly, splenomegaly or mass.     Tenderness: There is no abdominal tenderness.  Musculoskeletal:     Right lower leg: No edema.     Left lower leg: No edema.  Lymphadenopathy:     Upper Body:     Right upper body: No supraclavicular or axillary adenopathy.     Left upper body: No supraclavicular or axillary adenopathy.     Lower Body: No right inguinal adenopathy. No left inguinal adenopathy.  Neurological:     General: No focal deficit present.     Mental Status: She is alert and oriented to person, place, and time.  Psychiatric:        Mood and Affect: Mood normal.        Behavior: Behavior normal.      LABORATORY DATA:  I have reviewed the data as listed Recent Results (from the past 2160 hour(s))  IFOBT POC (occult bld, rslt in office)     Status: None   Collection Time: 04/25/21 11:12 AM  Result Value Ref Range   IFOBT Negative     RADIOGRAPHIC STUDIES: I have personally reviewed the radiological images as listed and agreed with the findings in the report. No results found.  ASSESSMENT:  Iron deficiency anemia: - Patient seen at the request of Dr. Gala Romney for parenteral iron therapy. - Labs on 04/04/2021: Ferritin 9, percent saturation 9. - She has recent diagnosis of CHF with preserved EF and has been on Jardiance since #2022. - She  complains of severe fatigue all the time.  No ice pica.  She took iron pill for 6 months and was off for 3 to 4 months and started back in April 2023.  She did not notice any improvement in energy levels. - Recent FOBT negative, colonoscopy in September 2021   Social/family history: - Lives at home with her husband.  She is a retired Marine scientist.  Non-smoker. - Father had lung cancer.  Maternal aunt had breast cancer and another maternal aunt had uterine cancer.   PLAN:  Iron deficiency anemia: - We will check baseline ferritin, iron panel and CBC today.  We will also check for other nutritional deficiencies. - We talked about parenteral iron therapy with Feraheme.  We talked about side effects in detail.  She had multiple allergies in the past.  Hence we will do premedication. - RTC 2 to 3 months for follow-up with repeat labs.   All questions were answered. The patient knows to call the clinic with any problems, questions or concerns.  Derek Jack, MD 07/11/21 11:07 AM  [place] [phone]   I, Thana Ates, am acting as a scribe for Dr. Derek Jack.  I, Derek Jack MD, have reviewed the above documentation for accuracy and completeness, and I agree with the above.

## 2021-07-14 LAB — KAPPA/LAMBDA LIGHT CHAINS
Kappa free light chain: 25.3 mg/L — ABNORMAL HIGH (ref 3.3–19.4)
Kappa, lambda light chain ratio: 0.82 (ref 0.26–1.65)
Lambda free light chains: 30.7 mg/L — ABNORMAL HIGH (ref 5.7–26.3)

## 2021-07-14 LAB — METHYLMALONIC ACID, SERUM: Methylmalonic Acid, Quantitative: 258 nmol/L (ref 0–378)

## 2021-07-15 LAB — PROTEIN ELECTROPHORESIS, SERUM
A/G Ratio: 1.5 (ref 0.7–1.7)
Albumin ELP: 4 g/dL (ref 2.9–4.4)
Alpha-1-Globulin: 0.2 g/dL (ref 0.0–0.4)
Alpha-2-Globulin: 0.8 g/dL (ref 0.4–1.0)
Beta Globulin: 0.9 g/dL (ref 0.7–1.3)
Gamma Globulin: 0.8 g/dL (ref 0.4–1.8)
Globulin, Total: 2.7 g/dL (ref 2.2–3.9)
Total Protein ELP: 6.7 g/dL (ref 6.0–8.5)

## 2021-07-15 LAB — COPPER, SERUM: Copper: 119 ug/dL (ref 80–158)

## 2021-07-16 ENCOUNTER — Inpatient Hospital Stay (HOSPITAL_COMMUNITY): Payer: Medicare Other

## 2021-07-16 VITALS — BP 171/90 | HR 60 | Temp 97.4°F | Resp 18

## 2021-07-16 DIAGNOSIS — D509 Iron deficiency anemia, unspecified: Secondary | ICD-10-CM | POA: Diagnosis not present

## 2021-07-16 MED ORDER — LORATADINE 10 MG PO TABS
10.0000 mg | ORAL_TABLET | Freq: Once | ORAL | Status: AC
  Administered 2021-07-16: 10 mg via ORAL
  Filled 2021-07-16: qty 1

## 2021-07-16 MED ORDER — SODIUM CHLORIDE 0.9 % IV SOLN
510.0000 mg | Freq: Once | INTRAVENOUS | Status: AC
  Administered 2021-07-16: 510 mg via INTRAVENOUS
  Filled 2021-07-16: qty 510

## 2021-07-16 MED ORDER — CLONIDINE HCL 0.1 MG PO TABS
0.2000 mg | ORAL_TABLET | Freq: Once | ORAL | Status: AC
  Administered 2021-07-16: 0.2 mg via ORAL
  Filled 2021-07-16: qty 2

## 2021-07-16 MED ORDER — SODIUM CHLORIDE 0.9 % IV SOLN: Freq: Once | INTRAVENOUS | Status: AC

## 2021-07-16 MED ORDER — FAMOTIDINE IN NACL 20-0.9 MG/50ML-% IV SOLN
20.0000 mg | Freq: Once | INTRAVENOUS | Status: AC
  Administered 2021-07-16: 20 mg via INTRAVENOUS
  Filled 2021-07-16: qty 50

## 2021-07-16 MED ORDER — METHYLPREDNISOLONE SODIUM SUCC 125 MG IJ SOLR
125.0000 mg | Freq: Once | INTRAMUSCULAR | Status: AC
  Administered 2021-07-16: 125 mg via INTRAVENOUS
  Filled 2021-07-16: qty 2

## 2021-07-16 NOTE — Progress Notes (Signed)
Patient presents today for Feraheme infusion per providers order.  Vital signs WNL.  Patient has no new complaints at this time.    Peripheral IV started and blood return noted pre and post infusion.    1600 Patient called out.  Having chest pressure.  Is currently on her post infusion wait time. 1604 Vital sign 176/82.  1606 MD notified and is here to assess the patient. 1609 Vital signs 189/76.  Patient will be monitored for 10 more minutes per MD instruction and if chest pressure has not resolved the patient will be sent to the Emergency Room. 1613 Vital signs 197/81. 1621 Chest pressure resolved. 1623 Vital signs 182/80 1626 Vital signs 195/84,  MD notified. 1630 MD ordered 0.2 Clonidine given and will recheck in 30 minutes. 1700 Vital signs 171/90, MD notified, per MD instruction patient free to go home.  Advised the patient that if she had any chest pain, SOB, swelling, or any other symptoms to go to the Emergency Room.  Patient agreeable  Discharge from clinic ambulatory in stable condition.  Alert and oriented X 3.  Follow up with [facility] as scheduled.

## 2021-07-16 NOTE — Patient Instructions (Signed)
[facility]  Discharge Instructions: Thank you for choosing [facility] to provide your oncology and hematology care.  If you have a lab appointment with the Cancer Center, please come in thru the Main Entrance and check in at the main information desk.  Wear comfortable clothing and clothing appropriate for easy access to any Portacath or PICC line.   We strive to give you quality time with your provider. You may need to reschedule your appointment if you arrive late (15 or more minutes).  Arriving late affects you and other patients whose appointments are after yours.  Also, if you miss three or more appointments without notifying the office, you may be dismissed from the clinic at the provider's discretion.      For prescription refill requests, have your pharmacy contact our office and allow 72 hours for refills to be completed.    Today you received the following chemotherapy and/or immunotherapy agents Feraheme      To help prevent nausea and vomiting after your treatment, we encourage you to take your nausea medication as directed.  BELOW ARE SYMPTOMS THAT SHOULD BE REPORTED IMMEDIATELY: *FEVER GREATER THAN 100.4 F (38 C) OR HIGHER *CHILLS OR SWEATING *NAUSEA AND VOMITING THAT IS NOT CONTROLLED WITH YOUR NAUSEA MEDICATION *UNUSUAL SHORTNESS OF BREATH *UNUSUAL BRUISING OR BLEEDING *URINARY PROBLEMS (pain or burning when urinating, or frequent urination) *BOWEL PROBLEMS (unusual diarrhea, constipation, pain near the anus) TENDERNESS IN MOUTH AND THROAT WITH OR WITHOUT PRESENCE OF ULCERS (sore throat, sores in mouth, or a toothache) UNUSUAL RASH, SWELLING OR PAIN  UNUSUAL VAGINAL DISCHARGE OR ITCHING   Items with * indicate a potential emergency and should be followed up as soon as possible or go to the Emergency Department if any problems should occur.  Please show the CHEMOTHERAPY ALERT CARD or IMMUNOTHERAPY ALERT CARD at check-in to the Emergency  Department and triage nurse.  Should you have questions after your visit or need to cancel or reschedule your appointment, please contact [facility] [phone]  and follow the prompts.  Office hours are 8:00 a.m. to 4:30 p.m. Monday - Friday. Please note that voicemails left after 4:00 p.m. may not be returned until the following business day.  We are closed weekends and major holidays. You have access to a nurse at all times for urgent questions. Please call the main number to the clinic [phone] and follow the prompts.  For any non-urgent questions, you may also contact your provider using MyChart. We now offer e-Visits for anyone 18 and older to request care online for non-urgent symptoms. For details visit [email or web address].   Also download the MyChart app! Go to the app store, search "MyChart", open the app, select [facility], and log in with your MyChart username and password.  Masks are optional in the cancer centers. If you would like for your care team to wear a mask while they are taking care of you, please let them know. For doctor visits, patients may have with them one support person who is at least 77 years old. At this time, visitors are not allowed in the infusion area.  

## 2021-07-17 ENCOUNTER — Telehealth (HOSPITAL_COMMUNITY): Payer: Self-pay | Admitting: *Deleted

## 2021-07-17 ENCOUNTER — Other Ambulatory Visit (HOSPITAL_COMMUNITY): Payer: Self-pay | Admitting: *Deleted

## 2021-07-17 MED ORDER — METHYLPREDNISOLONE 4 MG PO TBPK: ORAL_TABLET | ORAL | 0 refills | Status: AC

## 2021-07-17 NOTE — Telephone Encounter (Signed)
Patient called to advise that she has had swollen lips and tongue since arriving home following iron infusion yesterday.  Has taken benadryl, however swelling has not been resolved.  Denies SOB or difficulty breathing. Per Dr. Delton Coombes, medrol dose pack sent to pharmacy and advised patient to seek medical attention immediately if other symptoms develop.  Verbalized understanding.  States she will not take further scheduled infusions.

## 2021-07-18 ENCOUNTER — Other Ambulatory Visit: Payer: Self-pay | Admitting: Family Medicine

## 2021-07-18 DIAGNOSIS — Z1231 Encounter for screening mammogram for malignant neoplasm of breast: Secondary | ICD-10-CM

## 2021-07-18 NOTE — Progress Notes (Addendum)
Hypersensitivity Reaction note  Date of event: 07/16/2021 (date of event but note entered 07/18/2021) Time of event: 1600 Generic name of drug involved: Ferumoxytol Name of provider notified of the hypersensitivity reaction: Delton Coombes Was agent that likely caused hypersensitivity reaction added to Allergies List within EMR? yes Chain of events including reaction signs/symptoms, treatment administered, and outcome (e.g., drug resumed; drug discontinued; sent to Emergency Department; etc.) 1600 Patient called out.  Having chest pressure.  Is currently on her post infusion wait time. 1604 Vital sign 176/82.  1606 MD notified and is here to assess the patient. 1609 Vital signs 189/76.  Patient will be monitored for 10 more minutes per MD instruction and if chest pressure has not resolved the patient will be sent to the Emergency Room. 1613 Vital signs 197/81. 1621 Chest pressure resolved. 1623 Vital signs 182/80 1626 Vital signs 195/84,  MD notified. 1630 MD ordered 0.2 Clonidine given and will recheck in 30 minutes. 1700 Vital signs 171/90, MD notified, per MD instruction patient free to go home.  Advised the patient that if she had any chest pain, SOB, swelling, or any other symptoms to go to the Emergency Room.  Patient agreeable   Discharge from clinic ambulatory in stable condition.  Alert and oriented X 3.  Follow up with [facility] as scheduled  Marlane Hatcher, RN 07/18/2021 8:09 AM

## 2021-07-24 ENCOUNTER — Inpatient Hospital Stay (HOSPITAL_COMMUNITY): Payer: Medicare Other

## 2021-08-12 ENCOUNTER — Encounter: Payer: Self-pay | Admitting: Gastroenterology

## 2021-08-13 ENCOUNTER — Ambulatory Visit: Payer: Medicare Other | Admitting: Gastroenterology

## 2021-08-15 ENCOUNTER — Ambulatory Visit
Admission: RE | Admit: 2021-08-15 | Discharge: 2021-08-15 | Disposition: A | Discharge status: Home / Self Care | Payer: Medicare Other | Source: Ambulatory Visit | Attending: Family Medicine | Admitting: Family Medicine

## 2021-08-15 DIAGNOSIS — Z1231 Encounter for screening mammogram for malignant neoplasm of breast: Secondary | ICD-10-CM

## 2021-09-11 ENCOUNTER — Inpatient Hospital Stay: Payer: Medicare Other | Attending: Hematology

## 2021-09-11 DIAGNOSIS — Z79899 Other long term (current) drug therapy: Secondary | ICD-10-CM | POA: Diagnosis not present

## 2021-09-11 DIAGNOSIS — I509 Heart failure, unspecified: Secondary | ICD-10-CM | POA: Insufficient documentation

## 2021-09-11 DIAGNOSIS — D509 Iron deficiency anemia, unspecified: Secondary | ICD-10-CM

## 2021-09-11 DIAGNOSIS — D751 Secondary polycythemia: Secondary | ICD-10-CM | POA: Diagnosis not present

## 2021-09-11 DIAGNOSIS — E611 Iron deficiency: Secondary | ICD-10-CM | POA: Insufficient documentation

## 2021-09-11 LAB — IRON AND TIBC
Iron: 66 ug/dL (ref 28–170)
Saturation Ratios: 21 % (ref 10.4–31.8)
TIBC: 317 ug/dL (ref 250–450)
UIBC: 251 ug/dL

## 2021-09-11 LAB — CBC WITH DIFFERENTIAL/PLATELET
Abs Immature Granulocytes: 0.01 10*3/uL (ref 0.00–0.07)
Basophils Absolute: 0.1 10*3/uL (ref 0.0–0.1)
Basophils Relative: 1 %
Eosinophils Absolute: 0.4 10*3/uL (ref 0.0–0.5)
Eosinophils Relative: 6 %
HCT: 49.5 % — ABNORMAL HIGH (ref 36.0–46.0)
Hemoglobin: 15.8 g/dL — ABNORMAL HIGH (ref 12.0–15.0)
Immature Granulocytes: 0 %
Lymphocytes Relative: 26 %
Lymphs Abs: 1.7 10*3/uL (ref 0.7–4.0)
MCH: 26.6 pg (ref 26.0–34.0)
MCHC: 31.9 g/dL (ref 30.0–36.0)
MCV: 83.3 fL (ref 80.0–100.0)
Monocytes Absolute: 0.5 10*3/uL (ref 0.1–1.0)
Monocytes Relative: 8 %
Neutro Abs: 4 10*3/uL (ref 1.7–7.7)
Neutrophils Relative %: 59 %
Platelets: 244 10*3/uL (ref 150–400)
RBC: 5.94 MIL/uL — ABNORMAL HIGH (ref 3.87–5.11)
RDW: 18.9 % — ABNORMAL HIGH (ref 11.5–15.5)
WBC: 6.7 10*3/uL (ref 4.0–10.5)
nRBC: 0 % (ref 0.0–0.2)

## 2021-09-11 LAB — FERRITIN: Ferritin: 86 ng/mL (ref 11–307)

## 2021-09-15 ENCOUNTER — Ambulatory Visit: Payer: Medicare Other | Admitting: Gastroenterology

## 2021-09-17 NOTE — Progress Notes (Unsigned)
Ana Chandler, Ana Chandler [id number]   CLINIC:  Medical Oncology/Hematology  PCP:  Donalynn Furlong, MD [place] [address] [phone]   REASON FOR VISIT:  Follow-up for iron deficiency anemia  PRIOR THERAPY: Oral iron supplement  CURRENT THERAPY: IV iron infusions  INTERVAL HISTORY:  Ana Chandler 76 y.o. female returns for routine follow-up of her iron deficiency anemia.  She was seen for initial consultation by Dr. Delton Coombes on 07/11/2021 and received IV Feraheme on 07/16/2021.  During her Feraheme infusion on 07/16/2021, patient experienced chest pressure and elevated blood pressure despite premedication with IV steroids and Pepcid.  Iron was stopped and patient was given clonidine.  Chest pressure resolved within 30 minutes and blood pressure trended downward, therefore she was discharged home.  Per phone call on 07/17/2021, patient continued to have swollen lips and tongue after arriving home from iron infusion, which did not improve with Benadryl.  Therefore, prescription for Medrol Dosepak was sent to pharmacy.  Patient did not wish to continue with any of her other IV iron infusions after these events.  Although her most recent labs show normalization of her iron levels, she did not feel any improvement in her energy after her iron infusion in June.  She continues to have shortness of breath and intermittent chest pain, for which she follows with [facility] cardiology.  She has ongoing fatigue, restless legs, and lightheadedness.  No bright blood per rectum or melena.  No pica, headaches, or syncope.  She is taking iron tablet at home once daily.  She has 70% energy and 80% appetite. She endorses that she is maintaining a stable weight.   REVIEW OF SYSTEMS:  Review of Systems  Constitutional:  Positive for fatigue. Negative for appetite change, chills, diaphoresis, fever and unexpected weight change.  HENT:   Negative  for lump/mass and nosebleeds.   Eyes:  Negative for eye problems.  Respiratory:  Positive for shortness of breath. Negative for cough and hemoptysis.   Cardiovascular:  Positive for chest pain. Negative for leg swelling and palpitations.  Gastrointestinal:  Positive for constipation and diarrhea. Negative for abdominal pain, blood in stool, nausea and vomiting.  Genitourinary:  Negative for hematuria.   Skin: Negative.   Neurological:  Positive for numbness. Negative for dizziness, headaches and light-headedness.  Hematological:  Does not bruise/bleed easily.      PAST MEDICAL/SURGICAL HISTORY:  Past Medical History:  Diagnosis Date   Arthritis    Dyspnea 2018   High cholesterol    Hypertension    Sleep apnea    Past Surgical History:  Procedure Laterality Date   [place]   BIOPSY  10/23/2019   Procedure: BIOPSY;  Surgeon: Daneil Dolin, MD;  [facility];  Service: Endoscopy;;   Bone Spur Removal     BREAST BIOPSY Left 2008   CARDIAC CATHETERIZATION  2019   CHOLECYSTECTOMY  2000   COLONOSCOPY N/A 06/18/2014   Rourk: diverticulosis   COLONOSCOPY WITH PROPOFOL N/A 10/23/2019   Dr. Gala Romney: Diverticulosis of the entire colon, two 3 to 5 mm polyps removed from the cecum (pathology benign), suspected benign anorectal bleeding in the setting of constipation.  No future screening or surveillance colonoscopies needed due to age.   ESOPHAGOGASTRODUODENOSCOPY (EGD) WITH PROPOFOL N/A 10/23/2019   Dr. Gala Romney: Small hiatal hernia, 1.2 cm pedunculated polyp on a relatively long, thick pedicle in the antrum, surface ulcerated with overlying clot  and actively oozing.  In its entirety.  Base of the polyp was clipped x2.  Gastric biopsies obtained to assess for H. pylori.  No H. pylori noted.  Gastric polyp benign.   EYE SURGERY Left 2019   Toric lens L eye   HAND SURGERY Right 2015   KNEE ARTHROSCOPY Right 2017   PARTIAL HYSTERECTOMY  1977   POLYPECTOMY   10/23/2019   Procedure: POLYPECTOMY;  Surgeon: Daneil Dolin, MD;  Location: [facility];  Service: Endoscopy;;   THYROID LOBECTOMY Right 03/04/2018   THYROIDECTOMY Right 03/04/2018   Procedure: RIGHT THYROID LOBECTOMY;  Surgeon: Melida Quitter, MD;  Location: [facility];  Service: ENT;  Laterality: Right;   TUBAL LIGATION  1975     SOCIAL HISTORY:  Social History   Socioeconomic History   Marital status: Married    Spouse name: Not on file   Number of children: Not on file   Years of education: Not on file   Highest education level: Not on file  Occupational History   Not on file  Tobacco Use   Smoking status: Never   Smokeless tobacco: Never  Vaping Use   Vaping Use: Never used  Substance and Sexual Activity   Alcohol use: Yes    Alcohol/week: 0.0 standard drinks of alcohol    Comment: occ   Drug use: No   Sexual activity: Not Currently  Other Topics Concern   Not on file  Social History Narrative   Not on file   Social Determinants of Health   Financial Resource Strain: Not on file  Food Insecurity: Not on file  Transportation Needs: Not on file  Physical Activity: Not on file  Stress: Not on file  Social Connections: Not on file  Intimate Partner Violence: Not on file    FAMILY HISTORY:  Family History  Problem Relation Age of Onset   Hypercholesterolemia Mother    COPD Mother    Lung cancer Father    Colon cancer Neg Hx    Colon polyps Neg Hx    Celiac disease Neg Hx    Inflammatory bowel disease Neg Hx    Breast cancer Neg Hx     CURRENT MEDICATIONS:  Outpatient Encounter Medications as of 09/18/2021  Medication Sig   aspirin 81 MG tablet Take 81 mg by mouth daily.   Calcium Carbonate-Vitamin D (CALCIUM + D PO) Take 1 tablet by mouth 2 (two) times daily.    cholecalciferol (VITAMIN D) 1000 UNITS tablet Take 1,000 Units by mouth daily.    ferrous sulfate 325 (65 FE) MG tablet Take 325 mg by mouth once.   fluticasone (FLONASE) 50 MCG/ACT nasal spray  Place 1 spray into both nostrils daily.   GLUCOSAMINE-CHONDROITIN PO Take 1,500 mg by mouth 2 (two) times daily.    hydrochlorothiazide (HYDRODIURIL) 25 MG tablet Take 25 mg by mouth daily.    JARDIANCE 10 MG TABS tablet Take 10 mg by mouth daily.   magnesium oxide (MAG-OX) 400 MG tablet Take 400 mg by mouth daily.   Multiple Vitamins-Minerals (MULTIVITAMIN PO) Take 1 tablet by mouth daily.   naproxen sodium (ALEVE) 220 MG tablet Take 220 mg by mouth 2 (two) times daily as needed (for pain or headache).   NON FORMULARY Uses CPAP for sleep apnea   omeprazole (PRILOSEC) 20 MG capsule Take 20 mg by mouth daily.    rosuvastatin (CRESTOR) 10 MG tablet Take 10 mg by mouth daily.   No facility-administered encounter medications on file  as of 09/18/2021.    ALLERGIES:  Allergies  Allergen Reactions   Contrast Media [Iodinated Contrast Media] Hives    Tolerates premedication   Covid-19 (Mrna) Vaccine Anaphylaxis    Moderna Covid Vaccine   Other Anaphylaxis    moderna vaccine-had anaphylaxis reaction after #2 vaccine was given. (within minutes of injection.)    Feraheme [Ferumoxytol] Other (See Comments) and Hypertension    Chest pressure   Lisinopril Other (See Comments)    Severe abdominal pain   Torsemide Swelling    Tongue swelling, chest pain   Losartan     Angioedema     PHYSICAL EXAM:  ECOG PERFORMANCE STATUS: 1 - Symptomatic but completely ambulatory  There were no vitals filed for this visit. There were no vitals filed for this visit. Physical Exam Constitutional:      Appearance: Normal appearance.  HENT:     Head: Normocephalic and atraumatic.     Mouth/Throat:     Mouth: Mucous membranes are moist.  Eyes:     Extraocular Movements: Extraocular movements intact.     Pupils: Pupils are equal, round, and reactive to light.  Cardiovascular:     Rate and Rhythm: Normal rate and regular rhythm.     Pulses: Normal pulses.     Heart sounds: Normal heart sounds.   Pulmonary:     Effort: Pulmonary effort is normal.     Breath sounds: Normal breath sounds.     Comments: Diminished breath sounds in all lung fields Abdominal:     General: Bowel sounds are normal.     Palpations: Abdomen is soft.     Tenderness: There is no abdominal tenderness.  Musculoskeletal:        General: No swelling.     Right lower leg: No edema.     Left lower leg: No edema.  Lymphadenopathy:     Cervical: No cervical adenopathy.  Skin:    General: Skin is warm and dry.  Neurological:     General: No focal deficit present.     Mental Status: She is alert and oriented to person, place, and time.  Psychiatric:        Mood and Affect: Mood normal.        Behavior: Behavior normal.      LABORATORY DATA:  I have reviewed the labs as listed.  CBC    Component Value Date/Time   WBC 6.7 09/11/2021 1031   RBC 5.94 (H) 09/11/2021 1031   HGB 15.8 (H) 09/11/2021 1031   HCT 49.5 (H) 09/11/2021 1031   PLT 244 09/11/2021 1031   MCV 83.3 09/11/2021 1031   MCH 26.6 09/11/2021 1031   MCHC 31.9 09/11/2021 1031   RDW 18.9 (H) 09/11/2021 1031   LYMPHSABS 1.7 09/11/2021 1031   MONOABS 0.5 09/11/2021 1031   EOSABS 0.4 09/11/2021 1031   BASOSABS 0.1 09/11/2021 1031      Latest Ref Rng & Units 09/11/2020   12:35 PM 10/19/2019    2:01 PM 02/24/2018   10:34 AM  CMP  Glucose 70 - 99 mg/dL  114  95   BUN 8 - 23 mg/dL  15  17   Creatinine 0.44 - 1.00 mg/dL 0.90  0.82  0.79   Sodium 135 - 145 mmol/L  140  141   Potassium 3.5 - 5.1 mmol/L  3.5  4.0   Chloride 98 - 111 mmol/L  104  106   CO2 22 - 32 mmol/L  27  27  Calcium 8.9 - 10.3 mg/dL  9.8  9.6     DIAGNOSTIC IMAGING:  I have independently reviewed the relevant imaging and discussed with the patient.  ASSESSMENT & PLAN: 1.  Symptomatic iron deficiency without anemia: - Patient seen at the request of Dr. Gala Romney for parenteral iron therapy. - Labs on 04/04/2021: Ferritin 9, percent saturation 9. - At initial  presentation, she reported severe fatigue "all the time," without any improvement in energy levels after several months of taking iron pills.  She denied any ice pica.   - Recent FOBT negative (March 2023) - EGD (10/23/2019): Gastric polyp with active hemorrhage s/p polypectomy and clipping - Colonoscopy (10/23/2019): Diverticulosis, polyps - Hematology work-up (07/11/2021): Hgb 14.5/MCV 78.9 Ferritin 14 with iron saturation 12% Normal copper, folate, B12 SPEP and free light chains negative.  Normal LDH and reticulocytes. - Received Feraheme x1 on 07/16/2021, but experienced hypersensitivity reaction despite premedication (documented in HPI from today's note 09/17/2021) - She remains fatigued with lightheadedness, dyspnea on exertion, and intermittent chest pain despite normalization of iron levels - No bright blood per rectum or melena.   - Most recent labs (07/11/2021): Hgb 15.8/MCV 83.3, ferritin 86, iron saturation 21% - PLAN: Mild erythrocytosis noted, possibly from hemoconcentration.  No further work-up at present, but we will continue to monitor. - No indication for IV iron at this time, particularly in light of hypersensitivity reaction to Feraheme.  If absolutely necessary to receive IV iron in the future, we would consider alternative brand. - Recommend continuing oral iron supplement once daily with repeat labs and RTC in 4 months same-day office visit  2.  Social/family history: - PMH includes diagnosis of CHF with preserved EF, has been on Jardiance since 2022 - Lives at home with her husband.  She is a retired Marine scientist.  Non-smoker. - Father had lung cancer.  Maternal aunt had breast cancer and another maternal aunt had uterine cancer.   PLAN SUMMARY & DISPOSITION: Same-day labs + OV in 4 months  All questions were answered. The patient knows to call the clinic with any problems, questions or concerns.  Medical decision making: Low  Time spent on visit: I spent 15 minutes counseling  the patient face to face. The total time spent in the appointment was 22 minutes and more than 50% was on counseling.   Harriett Rush, PA-C  09/18/2021 10:22 AM

## 2021-09-18 ENCOUNTER — Inpatient Hospital Stay (HOSPITAL_BASED_OUTPATIENT_CLINIC_OR_DEPARTMENT_OTHER): Payer: Medicare Other | Admitting: Physician Assistant

## 2021-09-18 VITALS — BP 134/71 | HR 98 | Temp 97.8°F | Resp 18 | Ht 60.0 in | Wt 147.8 lb

## 2021-09-18 DIAGNOSIS — E611 Iron deficiency: Secondary | ICD-10-CM | POA: Diagnosis not present

## 2021-09-18 DIAGNOSIS — D509 Iron deficiency anemia, unspecified: Secondary | ICD-10-CM

## 2021-09-18 NOTE — Patient Instructions (Signed)
[facility] **   You were seen today by Tarri Abernethy PA-C for your iron deficiency anemia.    IRON DEFICIENCY: Your blood and iron levels look better.  You do not need any IV iron at this time.  We will try to avoid IV iron in the future due to your reaction, but if it were necessary, we could try a different formulation of IV iron.  Continue to take your iron tablet once daily.  The following tips will help your body to absorb iron better. Take over-the-counter ferrous sulfate 325 mg Take your iron in the morning. Take your iron pill with a glass of orange juice to help your body absorb it better. If you take any antacid or acid reflux medicine at home, take your iron at a different time, separated by at least 2 hours from your antacid medication. If you experience any constipation from your iron tablet, try taking over the stool softener such as Colace once daily. If you continue to have severe side effects such as constipation or upset stomach, you can decrease your iron to every other day instead. If you are still having severe side effects at lower dose, please stop taking your iron tablet and call our office to let us know.   FOLLOW-UP APPOINTMENT: Same-day labs and office visit in 4 months  ** Thank you for trusting me with your healthcare!  I strive to provide all of my patients with quality care at each visit.  If you receive a survey for this visit, I would be so grateful to you for taking the time to provide feedback.  Thank you in advance!  ~ Estelene Carmack                   Dr. Derek Jack   &   Tarri Abernethy, PA-C   - - - - - - - - - - - - - - - - - -    Thank you for choosing [facility] to provide your oncology and hematology care.  To afford each patient quality time with our provider, please arrive at least 15 minutes before your scheduled appointment  time.   If you have a lab appointment with the [facility] please come in thru the Main Entrance and check in at the main information desk.  You need to re-schedule your appointment should you arrive 10 or more minutes late.  We strive to give you quality time with our providers, and arriving late affects you and other patients whose appointments are after yours.  Also, if you no show three or more times for appointments you may be dismissed from the clinic at the providers discretion.     Again, thank you for choosing [facility].  Our hope is that these requests will decrease the amount of time that you wait before being seen by our physicians.       _____________________________________________________________  Should you have questions after your visit to [facility], please contact our office at [phone] and follow the prompts.  Our office hours are 8:00 a.m. and 4:30 p.m. Monday - Friday.  Please note that voicemails left after 4:00 p.m. may not be returned until the following business day.  We are closed weekends and major holidays.  You do have access to a nurse 24-7, just call the main number to the clinic  [phone] and do not press any options, hold on the line and a nurse will answer the phone.    For prescription refill requests, have your pharmacy contact our office and allow 72 hours.

## 2021-09-19 IMAGING — MG MM DIGITAL SCREENING BILAT W/ TOMO AND CAD
8 series · 8 of 24 positions shown · non-contrast
Comparison: Previous exam(s).

CLINICAL DATA: Screening.

EXAM:
DIGITAL SCREENING BILATERAL MAMMOGRAM WITH TOMOSYNTHESIS AND CAD
TECHNIQUE: Bilateral screening digital craniocaudal and mediolateral oblique
mammograms were obtained. Bilateral screening digital breast
tomosynthesis was performed. The images were evaluated with
computer-aided detection.

[R MLO synth-2D]
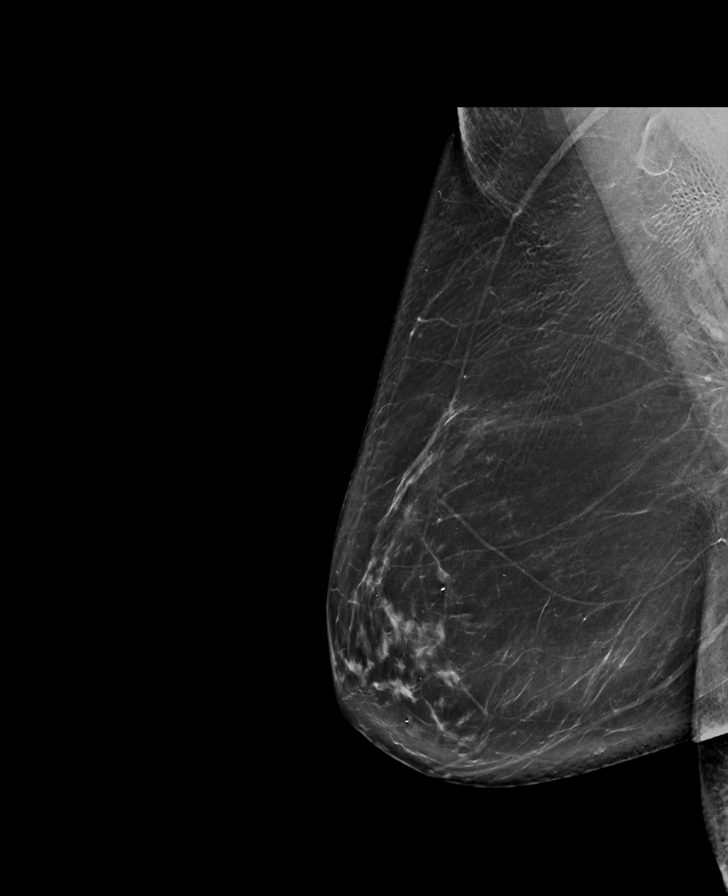

[R CC synth-2D]
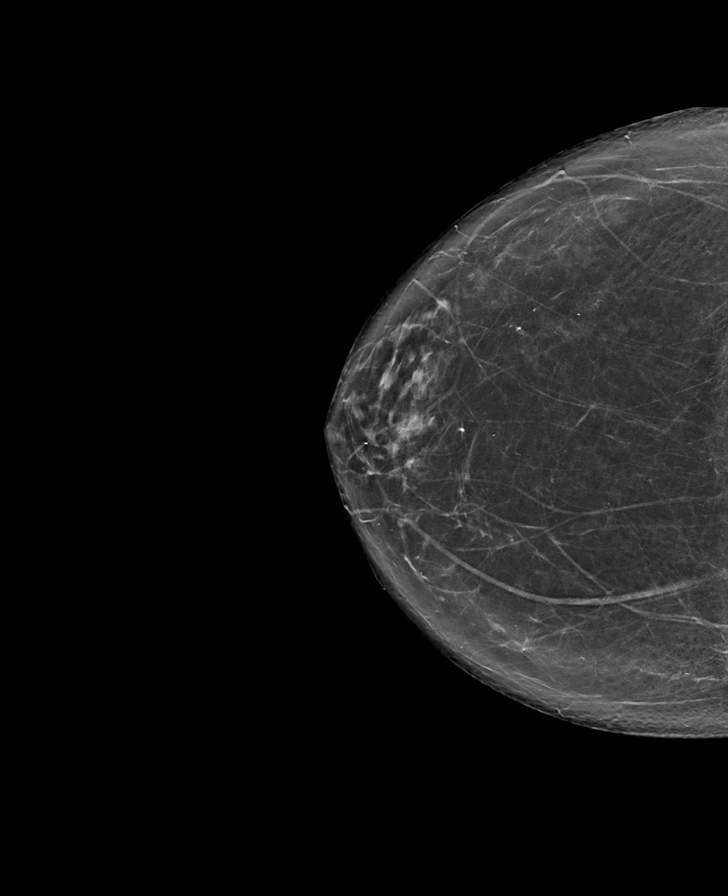

[L CC synth-2D]
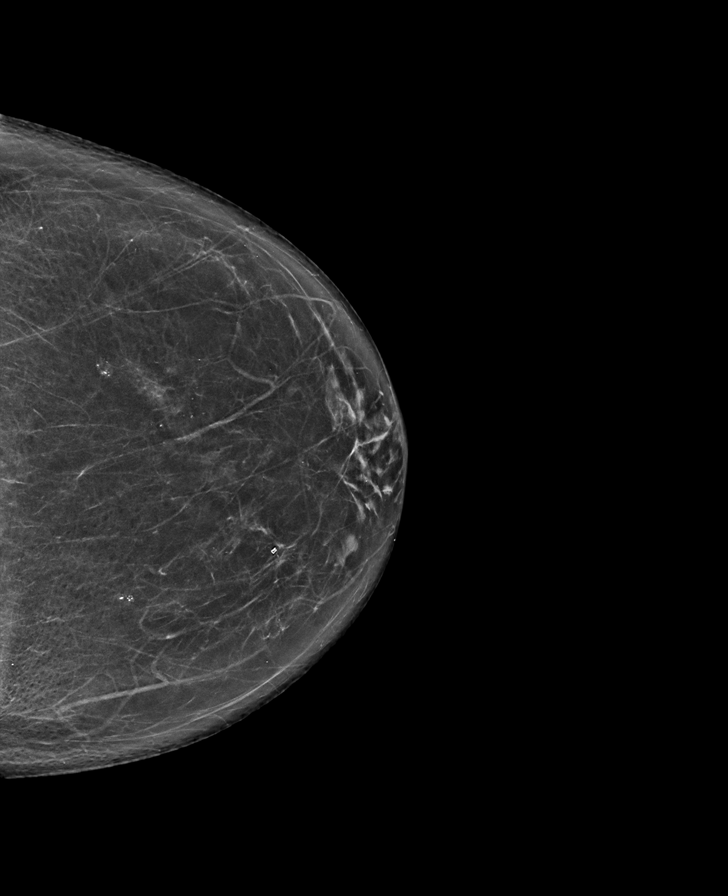

[L MLO synth-2D]
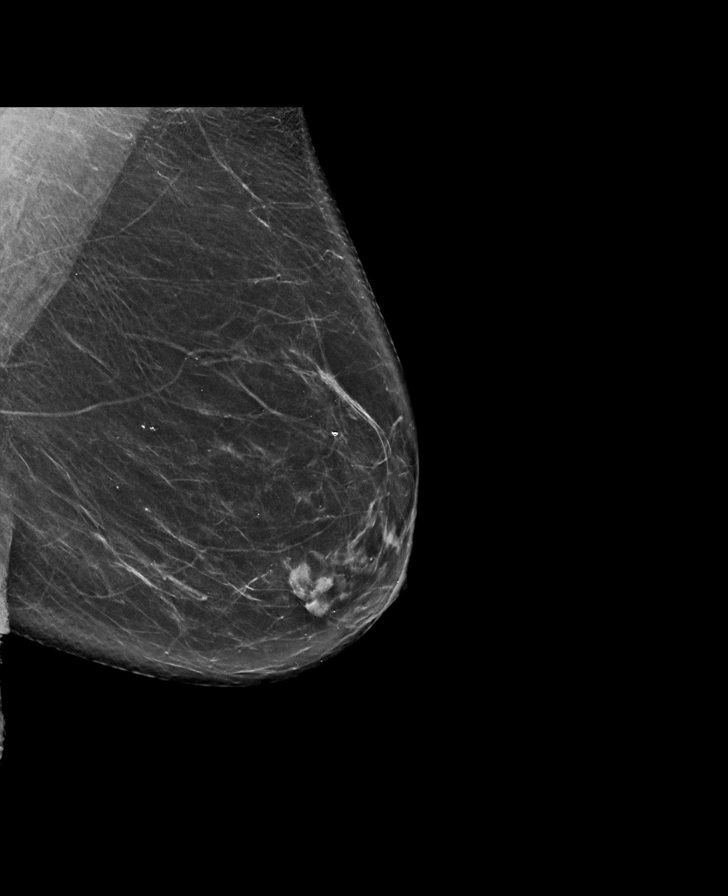

[R MLO tomo · tomo slice 47/92.0]
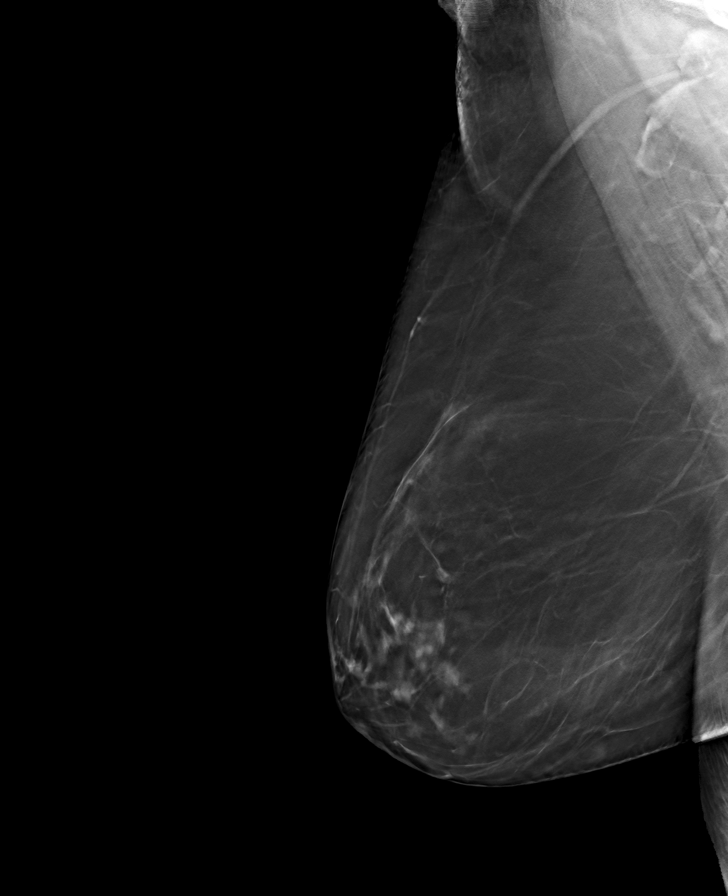

[L MLO tomo · tomo slice 43/85.0]
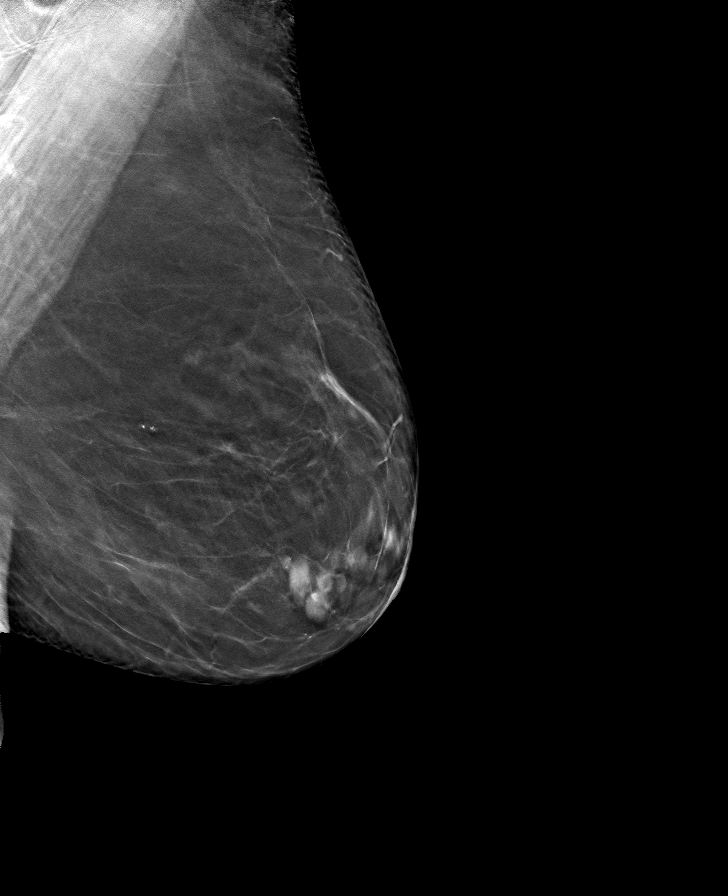

[L CC tomo · tomo slice 35/69.0]
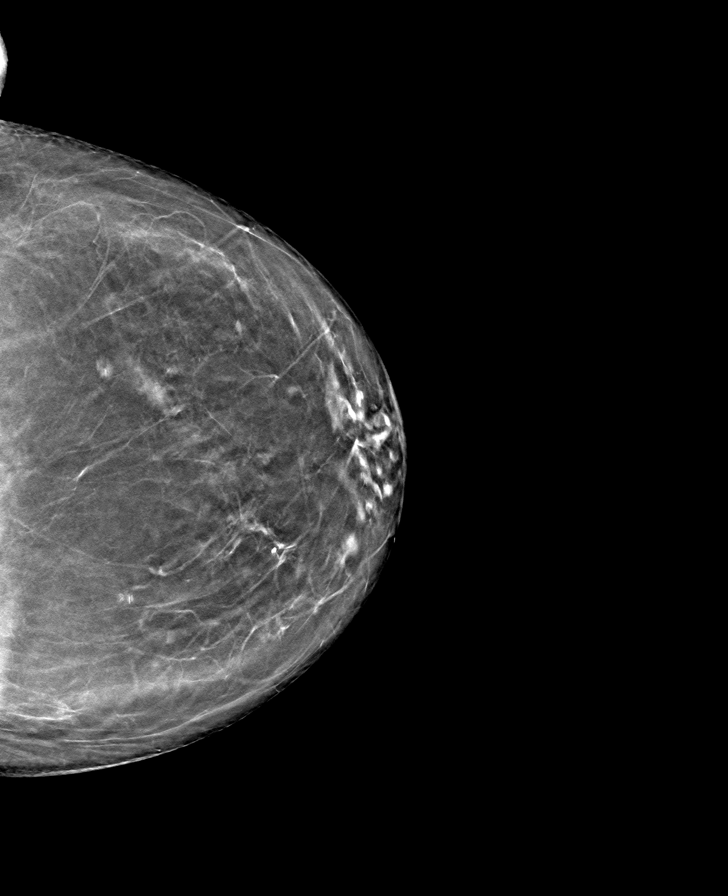

[R CC tomo · tomo slice 40/79.0]
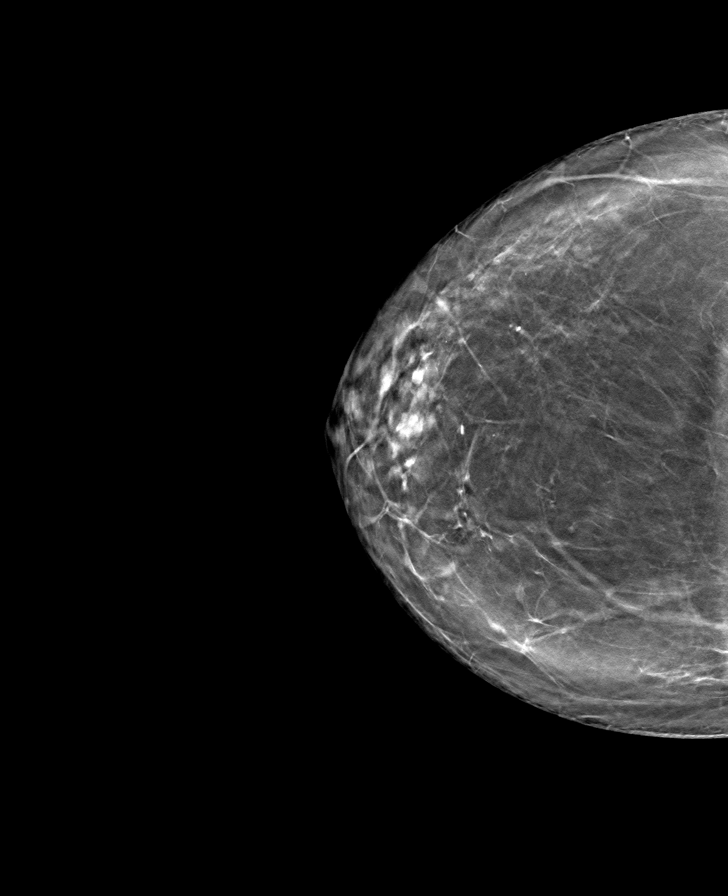

[8 of 24 positions shown; findings below may reference images not displayed]

ACR Breast Density Category b: There are scattered areas of
fibroglandular density.
FINDINGS: There are no findings suspicious for malignancy.
IMPRESSION: No mammographic evidence of malignancy. A result letter of this
screening mammogram will be mailed directly to the patient.

RECOMMENDATION:
Screening mammogram in one year. (Code:51-O-LD2)

BI-RADS CATEGORY  1: Negative.

## 2021-10-08 ENCOUNTER — Encounter: Payer: Self-pay | Admitting: Gastroenterology

## 2021-10-08 ENCOUNTER — Ambulatory Visit (INDEPENDENT_AMBULATORY_CARE_PROVIDER_SITE_OTHER): Payer: Medicare Other | Admitting: Gastroenterology

## 2021-10-08 VITALS — BP 150/77 | HR 61 | Temp 97.7°F | Ht 60.0 in | Wt 148.8 lb

## 2021-10-08 DIAGNOSIS — K5900 Constipation, unspecified: Secondary | ICD-10-CM | POA: Diagnosis not present

## 2021-10-08 DIAGNOSIS — K219 Gastro-esophageal reflux disease without esophagitis: Secondary | ICD-10-CM | POA: Diagnosis not present

## 2021-10-08 DIAGNOSIS — D509 Iron deficiency anemia, unspecified: Secondary | ICD-10-CM

## 2021-10-08 NOTE — Patient Instructions (Addendum)
Continue omeprazole '20mg'$  daily before breakfast.  Continue oral iron.  Increase dietary iron intake.  Limit naprosyn use as much as possible but no more than 1 per day.  Continue to follow with hematology. I will follow up on your next labs. Return to the office in six months or sooner if needed.

## 2021-10-08 NOTE — Progress Notes (Signed)
GI Office Note    Referring Provider: Dhivianathan, Candida Peeling, * Primary Care Physician:  Mikey Bussing Candida Peeling, MD  Primary Gastroenterologist: Garfield Cornea, MD   Chief Complaint   Chief Complaint  Patient presents with   Follow-up    Following up on IDA, states she had an iron infusion and had an allergic reaction.     History of Present Illness   Ana Chandler is a 76 y.o. female presenting today for follow up of IDA, constipation, GERD. Patient last seen in May 2023.   Large gastric polyp (with overlying blood clot and oozing) removed in 2021. Colonoscopy at the same time with two benign polyps removed. Stool heme-negative, Dr. Gala Romney advised holding off capsule endoscopy at time of last OV. Celiac serologies negative. Referred to hematology for management of IDA.   During her IV iron infusion on July 16, 2021 she experienced chest pressure and elevated blood pressure despite premedication with IV steroids and Pepcid.  Iron was stopped and she was given clonidine.  Follow-up phone call the next day, patient reported swollen lips and tongue after arriving home from iron infusion which did not improve with Benadryl.  She was given Medrol Dosepak. Plans to continue oral iron for now, patient reluctant to use any other types of IV iron.   Labs from August 2023: Iron 66, TIBC 317, iron saturation is 21%, ferritin 86 (up from 9), hemoglobin 15.8, hematocrit 49.5, platelets 244,000, B12 322.  Folate 38.3 (June 2023)  Patient states she has had anaphylaxis in the past with different medications.  She has an EpiPen on hand.  States that she should have used it when she reacted to IV iron but she was trying to avoid going to the emergency department.  In retrospect she realizes that was not a good decision.  Currently taking oral iron daily with orange juice.  Notes IV iron does not affect her fatigue.  Goes back to see her cardiologist in a few weeks.  Bowel movements have been  regular.  She had been taking MiraLAX 2 teaspoons daily.  For the last 2 weeks she has had a daily bowel movement, not requiring MiraLAX.  She typically uses it if she skips a day without a bowel movement.  Did not tolerate Linzess due to diarrhea.  Reflux has been well controlled.  Denies heartburn or dysphagia.  No vomiting.  No abdominal pain.     CT abdomen pelvis with contrast August 2022: Moderate burden of stool throughout the colon and rectum.  Diverticulosis.    Medications   Current Outpatient Medications  Medication Sig Dispense Refill   aspirin 81 MG tablet Take 81 mg by mouth daily.     Calcium Carbonate-Vitamin D (CALCIUM + D PO) Take 1 tablet by mouth 2 (two) times daily.      EPINEPHrine 0.3 mg/0.3 mL IJ SOAJ injection INJECT 0.3 ML IN THE MUSCLE ONCE FOR 1 DOSE     ferrous sulfate 325 (65 FE) MG tablet Take 325 mg by mouth once.     fluticasone (FLONASE) 50 MCG/ACT nasal spray Place 1 spray into both nostrils daily.     GLUCOSAMINE-CHONDROITIN PO Take 1,500 mg by mouth 2 (two) times daily.      hydrochlorothiazide (HYDRODIURIL) 25 MG tablet Take 25 mg by mouth daily.      JARDIANCE 10 MG TABS tablet Take 10 mg by mouth daily.     magnesium oxide (MAG-OX) 400 MG tablet Take 400 mg by  mouth daily.     Multiple Vitamins-Minerals (MULTIVITAMIN PO) Take 1 tablet by mouth daily.     naproxen sodium (ALEVE) 220 MG tablet Take 220 mg by mouth 2 (two) times daily as needed (for pain or headache).     NON FORMULARY Uses CPAP for sleep apnea     omeprazole (PRILOSEC) 20 MG capsule Take 20 mg by mouth daily.      rosuvastatin (CRESTOR) 10 MG tablet Take 10 mg by mouth daily.     No current facility-administered medications for this visit.    Allergies   Allergies as of 10/08/2021 - Review Complete 10/08/2021  Allergen Reaction Noted   Contrast media [iodinated contrast media] Hives 10/19/2019   Covid-19 (mrna) vaccine Anaphylaxis 02/05/2021   Other Anaphylaxis 10/23/2019    Feraheme [ferumoxytol] Other (See Comments) and Hypertension 07/16/2021   Lisinopril Other (See Comments) 06/18/2014   Torsemide Swelling 05/01/2021   Losartan  04/28/2019    Review of Systems   General: Negative for anorexia, weight loss, fever, chills, positive fatigue, weakness. ENT: Negative for hoarseness, difficulty swallowing , nasal congestion. CV: Negative for chest pain, angina, palpitations, dyspnea on exertion, peripheral edema.  Respiratory: Negative for dyspnea at rest, dyspnea on exertion, cough, sputum, wheezing.  GI: See history of present illness. GU:  Negative for dysuria, hematuria, urinary incontinence, urinary frequency, nocturnal urination.  Endo: Negative for unusual weight change.     Physical Exam   BP (!) 150/77 (BP Location: Left Arm, Patient Position: Sitting, Cuff Size: Normal)   Pulse 61   Temp 97.7 F (36.5 C) (Temporal)   Ht 5' (1.524 m)   Wt 148 lb 12.8 oz (67.5 kg)   SpO2 100%   BMI 29.06 kg/m    General: Well-nourished, well-developed in no acute distress.  Eyes: No icterus. Mouth: Oropharyngeal mucosa moist and pink , no lesions erythema or exudate. Lungs: Clear to auscultation bilaterally.  Heart: Regular rate and rhythm, no murmurs rubs or gallops.  Abdomen: Bowel sounds are normal, nontender, nondistended, no hepatosplenomegaly or masses,  no abdominal bruits or hernia , no rebound or guarding.  Rectal: not performed Extremities: No lower extremity edema. No clubbing or deformities. Neuro: Alert and oriented x 4   Skin: Warm and dry, no jaundice.   Psych: Alert and cooperative, normal mood and affect.  Labs   See hpi  Imaging Studies   No results found.  Assessment   GERD: Well-controlled.  Constipation: Well-controlled.  IDA: Improved post IV iron but unfortunately she developed a significant reaction to Feraheme.  Tolerating oral iron at this time.  Plans to continue follow-up with hematology.   PLAN   Continue  omeprazole 20 mg daily with breakfast.   Continue oral iron daily.   Increase dietary iron intake. Limit Naprosyn use is much as possible, try to consume no more than 1/day at the most. Continue to follow with hematology. Return to the office in 6 months or sooner if needed.    Laureen Ochs. Bobby Rumpf, [place], [facility]

## 2021-10-10 ENCOUNTER — Telehealth: Payer: Self-pay

## 2021-10-10 NOTE — Telephone Encounter (Signed)
She must have misunderstood. She mentioned that she needed a new EpiPen but I didn't say I would send RX.   I don't write for EpiPens. That should come from her PCP

## 2021-10-10 NOTE — Telephone Encounter (Signed)
Pt called stating that you were going to send in an rx for an epi pen. Pt states that the pharmacy has not received it. [facility].

## 2021-10-13 NOTE — Telephone Encounter (Signed)
Pt was made aware and verbalized understanding.  

## 2022-02-03 ENCOUNTER — Inpatient Hospital Stay: Payer: Medicare Other

## 2022-02-03 ENCOUNTER — Ambulatory Visit: Payer: Medicare Other | Admitting: Physician Assistant

## 2022-04-08 ENCOUNTER — Ambulatory Visit: Payer: Medicare Other | Admitting: Gastroenterology

## 2022-04-16 ENCOUNTER — Other Ambulatory Visit: Payer: Medicare Other

## 2022-04-16 ENCOUNTER — Ambulatory Visit: Payer: Medicare Other | Admitting: Physician Assistant

## 2022-07-07 ENCOUNTER — Other Ambulatory Visit: Payer: Self-pay

## 2022-07-07 DIAGNOSIS — Z1231 Encounter for screening mammogram for malignant neoplasm of breast: Secondary | ICD-10-CM

## 2022-08-21 ENCOUNTER — Ambulatory Visit
Admission: RE | Admit: 2022-08-21 | Discharge: 2022-08-21 | Disposition: A | Discharge status: Home / Self Care | Payer: Medicare Other | Source: Ambulatory Visit | Attending: Family Medicine | Admitting: Family Medicine

## 2022-08-21 DIAGNOSIS — Z1231 Encounter for screening mammogram for malignant neoplasm of breast: Secondary | ICD-10-CM

## 2023-07-16 ENCOUNTER — Other Ambulatory Visit: Payer: Self-pay | Admitting: Family Medicine

## 2023-07-16 DIAGNOSIS — Z1231 Encounter for screening mammogram for malignant neoplasm of breast: Secondary | ICD-10-CM

## 2023-09-29 ENCOUNTER — Ambulatory Visit
Admission: RE | Admit: 2023-09-29 | Discharge: 2023-09-29 | Disposition: A | Discharge status: Home / Self Care | Source: Ambulatory Visit | Attending: Family Medicine

## 2023-09-29 DIAGNOSIS — Z1231 Encounter for screening mammogram for malignant neoplasm of breast: Secondary | ICD-10-CM

## 2024-04-21 ENCOUNTER — Ambulatory Visit (HOSPITAL_COMMUNITY): Admit: 2024-04-21 | Admitting: Orthopedic Surgery

## 2024-04-21 SURGERY — ARTHROPLASTY, KNEE, TOTAL
Anesthesia: Choice | Site: Knee | Laterality: Right
